# Patient Record
Sex: Female | Born: 1980 | Race: Black or African American | Hispanic: No | Marital: Married | Smoking: Never smoker
Health system: Southern US, Community
[De-identification: ages and names within clinical notes are randomized; demographics above are authoritative.]

## PROBLEM LIST (undated history)

## (undated) ENCOUNTER — Inpatient Hospital Stay (HOSPITAL_COMMUNITY): Payer: Self-pay

## (undated) DIAGNOSIS — F419 Anxiety disorder, unspecified: Secondary | ICD-10-CM

## (undated) DIAGNOSIS — Z9889 Other specified postprocedural states: Secondary | ICD-10-CM

## (undated) DIAGNOSIS — G4730 Sleep apnea, unspecified: Secondary | ICD-10-CM

## (undated) DIAGNOSIS — R519 Headache, unspecified: Secondary | ICD-10-CM

## (undated) DIAGNOSIS — R51 Headache: Secondary | ICD-10-CM

## (undated) DIAGNOSIS — I10 Essential (primary) hypertension: Secondary | ICD-10-CM

## (undated) DIAGNOSIS — R351 Nocturia: Secondary | ICD-10-CM

## (undated) DIAGNOSIS — M2550 Pain in unspecified joint: Secondary | ICD-10-CM

## (undated) DIAGNOSIS — E559 Vitamin D deficiency, unspecified: Secondary | ICD-10-CM

## (undated) DIAGNOSIS — K5900 Constipation, unspecified: Secondary | ICD-10-CM

## (undated) DIAGNOSIS — K219 Gastro-esophageal reflux disease without esophagitis: Secondary | ICD-10-CM

## (undated) DIAGNOSIS — R112 Nausea with vomiting, unspecified: Secondary | ICD-10-CM

## (undated) HISTORY — DX: Anxiety disorder, unspecified: F41.9

## (undated) HISTORY — DX: Vitamin D deficiency, unspecified: E55.9

## (undated) HISTORY — DX: Gastro-esophageal reflux disease without esophagitis: K21.9

## (undated) HISTORY — DX: Constipation, unspecified: K59.00

## (undated) HISTORY — DX: Sleep apnea, unspecified: G47.30

## (undated) HISTORY — DX: Pain in unspecified joint: M25.50

---

## 2002-02-09 ENCOUNTER — Other Ambulatory Visit: Admission: RE | Admit: 2002-02-09 | Discharge: 2002-02-09 | Payer: Self-pay | Admitting: Obstetrics and Gynecology

## 2003-04-27 ENCOUNTER — Other Ambulatory Visit: Admission: RE | Admit: 2003-04-27 | Discharge: 2003-04-27 | Payer: Self-pay | Admitting: Obstetrics and Gynecology

## 2004-06-04 ENCOUNTER — Ambulatory Visit (HOSPITAL_COMMUNITY): Admission: RE | Admit: 2004-06-04 | Discharge: 2004-06-04 | Payer: Self-pay | Admitting: General Surgery

## 2004-06-04 ENCOUNTER — Encounter (INDEPENDENT_AMBULATORY_CARE_PROVIDER_SITE_OTHER): Payer: Self-pay | Admitting: *Deleted

## 2004-06-04 ENCOUNTER — Ambulatory Visit (HOSPITAL_BASED_OUTPATIENT_CLINIC_OR_DEPARTMENT_OTHER): Admission: RE | Admit: 2004-06-04 | Discharge: 2004-06-04 | Payer: Self-pay | Admitting: General Surgery

## 2004-06-04 HISTORY — PX: MASS EXCISION: SHX2000

## 2005-09-03 ENCOUNTER — Inpatient Hospital Stay (HOSPITAL_COMMUNITY): Admission: AD | Admit: 2005-09-03 | Discharge: 2005-09-03 | Payer: Self-pay | Admitting: Obstetrics and Gynecology

## 2005-09-09 ENCOUNTER — Inpatient Hospital Stay (HOSPITAL_COMMUNITY): Admission: RE | Admit: 2005-09-09 | Discharge: 2005-09-12 | Payer: Self-pay | Admitting: Obstetrics and Gynecology

## 2005-09-09 ENCOUNTER — Encounter (INDEPENDENT_AMBULATORY_CARE_PROVIDER_SITE_OTHER): Payer: Self-pay | Admitting: *Deleted

## 2005-09-18 ENCOUNTER — Inpatient Hospital Stay (HOSPITAL_COMMUNITY): Admission: AD | Admit: 2005-09-18 | Discharge: 2005-09-21 | Payer: Self-pay | Admitting: Obstetrics and Gynecology

## 2009-04-09 ENCOUNTER — Inpatient Hospital Stay (HOSPITAL_COMMUNITY): Admission: AD | Admit: 2009-04-09 | Discharge: 2009-04-09 | Payer: Self-pay | Admitting: Obstetrics and Gynecology

## 2009-09-02 ENCOUNTER — Inpatient Hospital Stay (HOSPITAL_COMMUNITY): Admission: AD | Admit: 2009-09-02 | Discharge: 2009-09-02 | Payer: Self-pay | Admitting: Obstetrics and Gynecology

## 2009-09-07 ENCOUNTER — Inpatient Hospital Stay (HOSPITAL_COMMUNITY): Admission: AD | Admit: 2009-09-07 | Discharge: 2009-09-07 | Payer: Self-pay | Admitting: Obstetrics & Gynecology

## 2009-09-09 ENCOUNTER — Ambulatory Visit (HOSPITAL_COMMUNITY): Admission: AD | Admit: 2009-09-09 | Discharge: 2009-09-09 | Payer: Self-pay | Admitting: Obstetrics

## 2009-09-13 ENCOUNTER — Inpatient Hospital Stay (HOSPITAL_COMMUNITY): Admission: RE | Admit: 2009-09-13 | Discharge: 2009-09-16 | Payer: Self-pay | Admitting: Obstetrics and Gynecology

## 2011-02-05 ENCOUNTER — Other Ambulatory Visit: Payer: Self-pay | Admitting: Obstetrics and Gynecology

## 2011-02-06 LAB — COMPREHENSIVE METABOLIC PANEL
ALT: 26 U/L (ref 0–35)
ALT: 26 U/L (ref 0–35)
AST: 35 U/L (ref 0–37)
AST: 35 U/L (ref 0–37)
Albumin: 2.7 g/dL — ABNORMAL LOW (ref 3.5–5.2)
Albumin: 3 g/dL — ABNORMAL LOW (ref 3.5–5.2)
Alkaline Phosphatase: 114 U/L (ref 39–117)
Alkaline Phosphatase: 115 U/L (ref 39–117)
BUN: 2 mg/dL — ABNORMAL LOW (ref 6–23)
BUN: 3 mg/dL — ABNORMAL LOW (ref 6–23)
CO2: 21 mEq/L (ref 19–32)
CO2: 25 mEq/L (ref 19–32)
Calcium: 9.2 mg/dL (ref 8.4–10.5)
Calcium: 9.2 mg/dL (ref 8.4–10.5)
Chloride: 104 mEq/L (ref 96–112)
Chloride: 106 mEq/L (ref 96–112)
Creatinine, Ser: 0.5 mg/dL (ref 0.4–1.2)
Creatinine, Ser: 0.71 mg/dL (ref 0.4–1.2)
GFR calc Af Amer: >60 mL/min (ref >60)
GFR calc Af Amer: >60 mL/min (ref >60)
GFR calc non Af Amer: >60 mL/min (ref >60)
GFR calc non Af Amer: >60 mL/min (ref >60)
Glucose, Bld: 70 mg/dL (ref 70–99)
Glucose, Bld: 77 mg/dL (ref 70–99)
Potassium: 3 mEq/L — ABNORMAL LOW (ref 3.5–5.1)
Potassium: 3.9 mEq/L (ref 3.5–5.1)
Sodium: 134 mEq/L — ABNORMAL LOW (ref 135–145)
Sodium: 136 mEq/L (ref 135–145)
Total Bilirubin: 0.5 mg/dL (ref 0.3–1.2)
Total Bilirubin: 0.7 mg/dL (ref 0.3–1.2)
Total Protein: 6 g/dL (ref 6.0–8.3)
Total Protein: 7.2 g/dL (ref 6.0–8.3)

## 2011-02-06 LAB — BASIC METABOLIC PANEL
BUN: 4 mg/dL — ABNORMAL LOW (ref 6–23)
BUN: 7 mg/dL (ref 6–23)
CO2: 23 mEq/L (ref 19–32)
CO2: 23 mEq/L (ref 19–32)
Calcium: 8.9 mg/dL (ref 8.4–10.5)
Calcium: 9.3 mg/dL (ref 8.4–10.5)
Chloride: 104 mEq/L (ref 96–112)
Chloride: 106 mEq/L (ref 96–112)
Creatinine, Ser: 0.58 mg/dL (ref 0.4–1.2)
Creatinine, Ser: 0.61 mg/dL (ref 0.4–1.2)
GFR calc Af Amer: >60 mL/min (ref >60)
GFR calc Af Amer: >60 mL/min (ref >60)
GFR calc non Af Amer: >60 mL/min (ref >60)
GFR calc non Af Amer: >60 mL/min (ref >60)
Glucose, Bld: 118 mg/dL — ABNORMAL HIGH (ref 70–99)
Glucose, Bld: 77 mg/dL (ref 70–99)
Potassium: 3.2 mEq/L — ABNORMAL LOW (ref 3.5–5.1)
Potassium: 3.8 mEq/L (ref 3.5–5.1)
Sodium: 133 mEq/L — ABNORMAL LOW (ref 135–145)
Sodium: 136 mEq/L (ref 135–145)

## 2011-02-06 LAB — CREATININE CLEARANCE, URINE, 24 HOUR
Collection Interval-CRCL: 24 hours
Creatinine Clearance: 170 mL/min — ABNORMAL HIGH (ref 75–115)
Creatinine, 24H Ur: 1742 mg/d (ref 700–1800)
Creatinine, Urine: 79.2 mg/dL
Creatinine: 0.71 mg/dL (ref 0.40–1.20)
Urine Total Volume-CRCL: 2200 mL

## 2011-02-06 LAB — CBC
HCT: 28.5 % — ABNORMAL LOW (ref 36.0–46.0)
HCT: 36.1 % (ref 36.0–46.0)
HCT: 36.6 % (ref 36.0–46.0)
HCT: 36.9 % (ref 36.0–46.0)
Hemoglobin: 11.9 g/dL — ABNORMAL LOW (ref 12.0–15.0)
Hemoglobin: 12.3 g/dL (ref 12.0–15.0)
Hemoglobin: 12.3 g/dL (ref 12.0–15.0)
Hemoglobin: 9.6 g/dL — ABNORMAL LOW (ref 12.0–15.0)
MCHC: 33.1 g/dL (ref 30.0–36.0)
MCHC: 33.4 g/dL (ref 30.0–36.0)
MCHC: 33.7 g/dL (ref 30.0–36.0)
MCHC: 33.7 g/dL (ref 30.0–36.0)
MCV: 95.8 fL (ref 78.0–100.0)
MCV: 96.3 fL (ref 78.0–100.0)
MCV: 96.4 fL (ref 78.0–100.0)
MCV: 97.3 fL (ref 78.0–100.0)
Platelets: 192 10*3/uL (ref 150–400)
Platelets: 228 10*3/uL (ref 150–400)
Platelets: 253 10*3/uL (ref 150–400)
Platelets: 259 10*3/uL (ref 150–400)
RBC: 2.93 MIL/uL — ABNORMAL LOW (ref 3.87–5.11)
RBC: 3.75 MIL/uL — ABNORMAL LOW (ref 3.87–5.11)
RBC: 3.82 MIL/uL — ABNORMAL LOW (ref 3.87–5.11)
RBC: 3.83 MIL/uL — ABNORMAL LOW (ref 3.87–5.11)
RDW: 14.3 % (ref 11.5–15.5)
RDW: 14.4 % (ref 11.5–15.5)
RDW: 14.6 % (ref 11.5–15.5)
RDW: 14.7 % (ref 11.5–15.5)
WBC: 10.6 10*3/uL — ABNORMAL HIGH (ref 4.0–10.5)
WBC: 11.3 10*3/uL — ABNORMAL HIGH (ref 4.0–10.5)
WBC: 13.1 10*3/uL — ABNORMAL HIGH (ref 4.0–10.5)
WBC: 17.3 10*3/uL — ABNORMAL HIGH (ref 4.0–10.5)

## 2011-02-06 LAB — LACTATE DEHYDROGENASE
LDH: 155 U/L (ref 94–250)
LDH: 169 U/L (ref 94–250)

## 2011-02-06 LAB — URIC ACID
Uric Acid, Serum: 5.3 mg/dL (ref 2.4–7.0)
Uric Acid, Serum: 5.4 mg/dL (ref 2.4–7.0)

## 2011-02-06 LAB — RPR: RPR Ser Ql: NONREACTIVE

## 2011-02-07 LAB — COMPREHENSIVE METABOLIC PANEL
ALT: 25 U/L (ref 0–35)
AST: 32 U/L (ref 0–37)
Albumin: 2.8 g/dL — ABNORMAL LOW (ref 3.5–5.2)
Alkaline Phosphatase: 112 U/L (ref 39–117)
BUN: 3 mg/dL — ABNORMAL LOW (ref 6–23)
CO2: 25 mEq/L (ref 19–32)
Calcium: 9 mg/dL (ref 8.4–10.5)
Chloride: 107 mEq/L (ref 96–112)
Creatinine, Ser: 0.65 mg/dL (ref 0.4–1.2)
GFR calc Af Amer: >60 mL/min (ref >60)
GFR calc non Af Amer: >60 mL/min (ref >60)
Glucose, Bld: 91 mg/dL (ref 70–99)
Potassium: 3.3 mEq/L — ABNORMAL LOW (ref 3.5–5.1)
Sodium: 137 mEq/L (ref 135–145)
Total Bilirubin: 0.8 mg/dL (ref 0.3–1.2)
Total Protein: 6.6 g/dL (ref 6.0–8.3)

## 2011-02-07 LAB — URINALYSIS, ROUTINE W REFLEX MICROSCOPIC
Bilirubin Urine: NEGATIVE
Glucose, UA: NEGATIVE mg/dL
Hgb urine dipstick: NEGATIVE
Ketones, ur: NEGATIVE mg/dL
Nitrite: NEGATIVE
Protein, ur: NEGATIVE mg/dL
Specific Gravity, Urine: <1.005 — ABNORMAL LOW (ref 1.005–1.030)
Urobilinogen, UA: 0.2 mg/dL (ref 0.0–1.0)
pH: 7 (ref 5.0–8.0)

## 2011-02-07 LAB — URIC ACID: Uric Acid, Serum: 5.2 mg/dL (ref 2.4–7.0)

## 2011-02-07 LAB — CBC
HCT: 35.2 % — ABNORMAL LOW (ref 36.0–46.0)
Hemoglobin: 11.8 g/dL — ABNORMAL LOW (ref 12.0–15.0)
MCHC: 33.3 g/dL (ref 30.0–36.0)
MCV: 97.2 fL (ref 78.0–100.0)
Platelets: 256 10*3/uL (ref 150–400)
RBC: 3.63 MIL/uL — ABNORMAL LOW (ref 3.87–5.11)
RDW: 14.1 % (ref 11.5–15.5)
WBC: 10.2 10*3/uL (ref 4.0–10.5)

## 2011-02-07 LAB — LACTATE DEHYDROGENASE: LDH: 155 U/L (ref 94–250)

## 2011-03-22 NOTE — Op Note (Signed)
NAME:  Stephanie Hays, Stephanie Hays                           ACCOUNT NO.:  [id number]   MEDICAL RECORD NO.:  [id number]                   PATIENT TYPE:  AMB   LOCATION:  DSC                                  FACILITY:  [facility]   PHYSICIAN:  Vikki Ports, M.D.         DATE OF BIRTH:  Sep 10, 1981   DATE OF PROCEDURE:  06/04/2004  DATE OF DISCHARGE:                                 OPERATIVE REPORT   REFERRING PHYSICIAN:  Maxie Better, M.D.   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   PROCEDURE:  Excision of mass of subcutaneous tissue of the abdominal wall.   ANESTHESIA:  General anesthesia.   SURGEON:  Vikki Ports, M.D.   DESCRIPTION OF PROCEDURE:  The patient was taken to the operating room and  placed in the supine position.  After adequate general anesthesia was  induced, the abdomen was prepped and draped in normal sterile fashion.  Using a transverse incision through the previous Pfannenstiel incision over  the right lateral aspect of the scar, I dissected down onto a palpable mass  which was very adherent to the fascia.  This measured about 3 x 3 cm  and  was excised in its entirety.  The fascia did have a slight defect and  therefore was closed with interrupted 3.0 Surgilon.  Subcutaneous tissue was  closed with 2-0 Vicryl.  Skin was closed with subcuticular 3-0 Monocryl.  Dermabond dressing was placed.  The patient tolerated the procedure well and  went to PACU in good condition.                                               Vikki Ports, M.D.    [facility]/MEDQ  D:  06/05/2004  T:  06/05/2004  Job:  [id number]   cc:   Maxie Better, M.D.  [address]  [place]  [place] [address]  Fax: [phone]

## 2011-03-22 NOTE — Discharge Summary (Signed)
NAMEAUNDREA, Stephanie Hays                 ACCOUNT NO.:  [id number]   MEDICAL RECORD NO.:  [id number]          PATIENT TYPE:  INP   LOCATION:  9374                          [facility]   PHYSICIAN:  Maxie Better, M.D.DATE OF BIRTH:  1981-09-22   DATE OF ADMISSION:  09/18/2005  DATE OF DISCHARGE:  09/21/2005                                 DISCHARGE SUMMARY   ADMISSION DIAGNOSIS:  Postpartum preeclampsia.   DISCHARGE DIAGNOSIS:  Postpartum preeclampsia, resolved.   HISTORY OF PRESENT ILLNESS:  A 30 year old G71, P1-1-0-1 female at nine days  postpartum status post a repeat cesarean section, in labor, who presented to  the office complaining of increased leg swelling and chest pain for four  days duration.  The patient had no headaches, visual changes or epigastric  pain.  She had no history of hypertension.  The pain was described as dull  and noted at rest there was no calf pain.  The patient was evaluated in the  office and then sent to the hospital for further lab results and monitoring.   HOSPITAL COURSE:  The patient presented to maternity admissions.  Her blood  pressure was 176/97, temperature 98, pulse 56.  Her exam was notable for  heart regular rate and rhythm without murmur, lungs clear to auscultation.  Her incision had a small superficial area that was slightly open, but  otherwise, unremarkable.  Extremities had 1 to 2+ edema.  Deep tendon  reflexes was 1+, no clonus.  PIH labs were obtained that showed a uric acid  of 6.7, hematocrit 34.8, platelets 395,000.  SGOT was 44, SGPT 77.  The  patient had chest x-ray that showed mild cardiomegaly.  No pulmonary edema.  EKG showed sinus bradycardia.  Given the exam and the labs, the patient was  diagnosed with postpartum preeclampsia.  She was admitted.  Magnesium  sulfate was started.  She was given a GI cocktail for her chest discomfort  and Labetalol 100 mg p.o. b.i.d. was started.  The exam was not suggestive  of a PE,  and pulmonary edema had been ruled out by the chest x-ray.  The  patient was continued on magnesium sulfate.  The chest pain resolved by  hospital day #2.  She had serial PIH labs drawn.  LFT's began improving.  The patient subsequently started to have good diuresing for the magnesium  after about 36 hours.  By hospital day #4, the patient's blood pressure was  130-140/75-85.  She felt well.  No shortness of breath.  No other symptoms.  Her edema had resolved.  Deep tendon reflexes were normal.  PIH labs were  redone, and thus showed AST 25, ALT 38 which were normal.  Uric acid was  7.4.  Her hematocrit was 36.8.  Magnesium sulfate was discontinued.  The  patient was sent discharged home after 4 hours after discontinuation of the  magnesium.   DISPOSITION:  Home.   CONDITION ON DISCHARGE:  Stable.   FOLLOWUP:  Follow up at [facility] in one week.   DISCHARGE MEDICATIONS:  1.  Labetalol 100 mg p.o.  b.i.d.  2.  Tylox 1-2 tablets q.3-4 h p.r.n. pain.  3.  Prenatal vitamins one p.o. daily.   DISCHARGE INSTRUCTIONS:  Discharge instructions were for preeclampsia  warning signs and continue the discharge instructions for her previous  admission for her cesarean section.      Maxie Better, M.D.  Electronically Signed     [place]/MEDQ  D:  10/30/2005  T:  10/30/2005  Job:  [id number]

## 2011-03-22 NOTE — Discharge Summary (Signed)
Stephanie Hays, Stephanie Hays                 ACCOUNT NO.:  [id number]   MEDICAL RECORD NO.:  [id number]          PATIENT TYPE:  INP   LOCATION:  9126                          [facility]   PHYSICIAN:  Maxie Better, M.D.DATE OF BIRTH:  1981/02/02   DATE OF ADMISSION:  09/09/2005  DATE OF DISCHARGE:  09/12/2005                                 DISCHARGE SUMMARY   ADDITIONAL DIAGNOSES:  1.  Previous cesarean section.  2.  Labor.  3.  Intrauterine gestation at 37-3/7 weeks.   DISCHARGE DIAGNOSES:  1.  Intrauterine gestation at 37-3/7 weeks, delivered.  2.  Labor.  3.  Previous cesarean section.   PROCEDURES:  Repeat cesarean section for hysterotomy.   HISTORY OF PRESENT ILLNESS:  This is a 30 year old gravida 2, para 0-1-0-0,  married black female at 37-3/7 weeks who was scheduled for a repeat cesarean  section who presented in labor on September 09, 2005.  At the time that the  patient presented, she was 3, 80%, -2 and a bag of water intact.  She had a  reactive tracing.  The patient was counseled regarding a repeat cesarean  section.  The patient had had a 30 year old gravida 2, para 0-1-0-0 with subsequent neonatal death 31  hours of life.   HOSPITAL COURSE:  The patient was admitted to [facility].  She was  taken to the operating room where she underwent a repeat cesarean section.  The procedure resulted in a delivery of a live female from the right occiput  transverse position.  Apgars of 9 and 9.  Cord around the neck x1 was noted  at the time.  Weight of the baby was 6 pounds 4 ounces.  Postoperatively,  the patient did well.  She had a CBC done on postop day #1 that showed a  hemoglobin of 11.0, white count 11.6, platelet 223,000.  By postop day #3,  the patient was afebrile, tolerating a regular diet.  No evidence of an  infection.  She was to be discharged home.   DISPOSITION:  Home.   CONDITION ON DISCHARGE:  Stable.   DISCHARGE MEDICATIONS:  1.  Tylox #20 one p.o. q.4 h p.r.n. pain.  2.  Prenatal vitamins one p.o. daily.   FOLLOWUP:  Follow up appointment at  [facility] in four weeks.   DISCHARGE INSTRUCTIONS:  Postpartum booklet given.      Maxie Better, M.D.  Electronically Signed     [place]/MEDQ  D:  10/30/2005  T:  10/30/2005  Job:  [id number]

## 2011-03-22 NOTE — Op Note (Signed)
Stephanie Hays, Stephanie Hays                 ACCOUNT NO.:  [id number]   MEDICAL RECORD NO.:  [id number]          PATIENT TYPE:  INP   LOCATION:  9126                          [facility]   PHYSICIAN:  Maxie Better, M.D.DATE OF BIRTH:  17-Feb-1981   DATE OF PROCEDURE:  09/09/2005  DATE OF DISCHARGE:                                 OPERATIVE REPORT   PREOPERATIVE DIAGNOSES:  1.  Previous cesarean section.  2.  Early labor.   OPERATION/PROCEDURE:  Repeat cesarean section, Kerr hysterotomy.   POSTOPERATIVE DIAGNOSES:  1.  Previous cesarean section.  2.  Early labor.   ANESTHESIA:  Spinal.   SURGEON:  Maxie Better, M.D.   ASSISTANT:  None.   INDICATIONS:  A 30 year old, gravida 2, para 0-1-0-0 female at 38+ weeks  with a previous low vertical uterine incision who presented in the office  and was found to be 3, 80%, -2, vertex presentation in early labor and who  now presents for repeat cesarean section.  The patient was initially  scheduled for a repeat cesarean section on September 19, 2005.  She has been  contracting on a regular basis with fetal movement and intact membranes.  The risks and benefits of the procedure have been explained to the patient.  Consent was signed. The patient was transferred to the operating room.   DESCRIPTION OF PROCEDURE:  Under adequate spinal anesthesia, the patient was  placed in the supine position with the left lateral tilt. She was sterilely  prepped and draped in the usual fashion.  An indwelling Foley catheter was  sterilely placed and 10 mL of 0.25% Marcaine was injected along the  previously Pfannenstiel skin incision.  Pfannenstiel skin incision was then  made through the previous scar, carried down to the rectus fascia.  Rectus  fascia was incised in the midline, extended bilaterally.  The rectus fascia  was then bluntly and sharply dissected off the rectus muscle in superior and  inferior fashion.  The rectus muscle was split in  the midline sharply.  The  parietal peritoneum was subsequently opened in the process.  On entering the  abdominal cavity, omentum adhesions were noted on the left anterior  abdominal wall and were lysed.  The vesicouterine peritoneum then opened  transversely.  The bladder was then displaced inferiorly after blunt  dissection.  A curvilinear low transverse uterine incision was then made and  extended bilaterally using the bandage scissors.  Artificial rupture of  membranes was then performed.  Copious clear amount of fluid was noted.  Subsequent delivery of a live female from the right occipital transverse  position was accomplished.  The baby was bulb suctioned on the abdomen.  Cord around the neck x1 was reducible.  Baby was delivered.  Cord was  clamped and cut.  The patient was transverse to the awaiting pediatricians  who assigned Apgars of 9 and 9 at one and five minutes.  The weight of the  baby was 6 pounds 4 ounces.   The placenta was spontaneous intact.  The uterine cavity was then cleaned of  debris.  Uterine  incision noted no extension.  Uterine incision was then  closed in two layers, the first layer with 0 Monocryl running locked stitch.  Second layer was imbricated using 0 Monocryl suture.  Small bleeders along  the peritoneal edges were cauterized.  The abdomen was copiously irrigated  and suctioned.  Paracolic gutters were cleaned of debris.  Normal tubes were  noted bilaterally.  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  Uterine incision was  reinspected.  Good hemostasis was then noted.  The parietal peritoneum was  then closed with 2-0 Vicryl running stitch.  The rectus fascia was closed  with 0 Vicryl x2.  The subcutaneous area was irrigated and suctioned with 3-  0 plain suture, interrupted was in place.  The skin was approximated using  Ethicon staples.  Specimen was placenta sent to pathology.  Estimated  blood  loss was 600 mL.  Intraoperative fluid was 2200 mL crystalloid.  Urine  output was 100 mL clear yellow urine.  Sponge and instruments counts x2 were  correct.  There were no complications.   The patient tolerated the procedure well and was transferred to the recovery  room in stable condition.      Maxie Better, M.D.  Electronically Signed     [place]/MEDQ  D:  09/09/2005  T:  09/10/2005  Job:  [id number]

## 2011-10-09 LAB — OB RESULTS CONSOLE HEPATITIS B SURFACE ANTIGEN: Hepatitis B Surface Ag: NEGATIVE

## 2011-10-09 LAB — OB RESULTS CONSOLE HIV ANTIBODY (ROUTINE TESTING): HIV: NONREACTIVE

## 2011-10-09 LAB — OB RESULTS CONSOLE ABO/RH: RH Type: POSITIVE

## 2011-10-09 LAB — OB RESULTS CONSOLE GC/CHLAMYDIA: Chlamydia: NEGATIVE

## 2012-02-20 LAB — OB RESULTS CONSOLE RPR: RPR: NONREACTIVE

## 2012-03-18 ENCOUNTER — Encounter (HOSPITAL_COMMUNITY): Payer: Self-pay | Admitting: *Deleted

## 2012-03-18 ENCOUNTER — Inpatient Hospital Stay (HOSPITAL_COMMUNITY)
Admission: AD | Admit: 2012-03-18 | Discharge: 2012-03-18 | Disposition: A | Discharge status: Home / Self Care | Payer: BC Managed Care – PPO | Source: Ambulatory Visit | Attending: Obstetrics and Gynecology | Admitting: Obstetrics and Gynecology

## 2012-03-18 ENCOUNTER — Inpatient Hospital Stay (HOSPITAL_COMMUNITY): Payer: BC Managed Care – PPO

## 2012-03-18 DIAGNOSIS — O368390 Maternal care for abnormalities of the fetal heart rate or rhythm, unspecified trimester, not applicable or unspecified: Secondary | ICD-10-CM | POA: Insufficient documentation

## 2012-03-18 HISTORY — DX: Essential (primary) hypertension: I10

## 2012-03-18 LAB — URIC ACID: Uric Acid, Serum: 4.8 mg/dL (ref 2.4–7.0)

## 2012-03-18 LAB — COMPREHENSIVE METABOLIC PANEL
AST: 31 U/L (ref 0–37)
Albumin: 2.6 g/dL — ABNORMAL LOW (ref 3.5–5.2)
BUN: 5 mg/dL — ABNORMAL LOW (ref 6–23)
Calcium: 9.1 mg/dL (ref 8.4–10.5)
Chloride: 102 mEq/L (ref 96–112)
Creatinine, Ser: 0.69 mg/dL (ref 0.50–1.10)
Total Bilirubin: 0.3 mg/dL (ref 0.3–1.2)

## 2012-03-18 LAB — CBC
HCT: 32.4 % — ABNORMAL LOW (ref 36.0–46.0)
MCH: 31 pg (ref 26.0–34.0)
MCHC: 34 g/dL (ref 30.0–36.0)
MCV: 91.3 fL (ref 78.0–100.0)
Platelets: 236 10*3/uL (ref 150–400)
RDW: 13.6 % (ref 11.5–15.5)
WBC: 9.3 10*3/uL (ref 4.0–10.5)

## 2012-03-18 MED ORDER — POTASSIUM CHLORIDE CRYS ER 20 MEQ PO TBCR
40.0000 meq | EXTENDED_RELEASE_TABLET | Freq: Once | ORAL | Status: AC
  Administered 2012-03-18: 40 meq via ORAL
  Filled 2012-03-18: qty 2

## 2012-03-18 NOTE — MAU Note (Signed)
Pt sent from MD office for monitoring, pt states FHR had "dips" on the monitor & wasn't moving much.

## 2012-03-18 NOTE — Progress Notes (Signed)
Lab results reported to MD, K-dur order received, also DC order.

## 2012-03-18 NOTE — Discharge Instructions (Signed)
Hypokalemia Hypokalemia means a low potassium level in the blood.Potassium is an electrolyte that helps regulate the amount of fluid in the body. It also stimulates muscle contraction and maintains a stable acid-base balance.Most of the body's potassium is inside of cells, and only a very small amount is in the blood. Because the amount in the blood is so small, minor changes can have big effects. PREPARATION FOR TEST Testing for potassium requires taking a blood sample taken by needle from a vein in the arm. The skin is cleaned thoroughly before the sample is drawn. There is no other special preparation needed. NORMAL VALUES Potassium levels below 3.5 mEq/L are abnormally low. Levels above 5.1 mEq/L are abnormally high. Ranges for normal findings may vary among different laboratories and hospitals. You should always check with your doctor after having lab work or other tests done to discuss the meaning of your test results and whether your values are considered within normal limits. MEANING OF TEST  Your caregiver will go over the test results with you and discuss the importance and meaning of your results, as well as treatment options and the need for additional tests, if necessary. A potassium level is frequently part of a routine medical exam. It is usually included as part of a whole "panel" of tests for several blood salts (such as Sodium and Chloride). It may be done as part of follow-up when a low potassium level was found in the past or other blood salts are suspected of being out of balance. A low potassium level might be suspected if you have one or more of the following:  Symptoms of weakness.   Abnormal heart rhythms.   High blood pressure and are taking medication to control this, especially water pills (diuretics).   Kidney disease that can affect your potassium level .   Diabetes requiring the use of insulin. The potassium may fall after taking insulin, especially if the  diabetes had been out of control for a while.   A condition requiring the use of cortisone-type medication or certain types of antibiotics.   Vomiting and/or diarrhea for more than a day or two.   A stomach or intestinal condition that may not permit appropriate absorption of potassium.   Fainting episodes.   Mental confusion.  OBTAINING TEST RESULTS It is your responsibility to obtain your test results. Ask the lab or department performing the test when and how you will get your results.  Please contact your caregiver directly if you have not received the results within one week. At that time, ask if there is anything different or new you should be doing in relation to the results. TREATMENT Hypokalemia can be treated with potassium supplements taken by mouth and/or adjustments in your current medications. A diet high in potassium is also helpful. Foods with high potassium content are:  Peas, lentils, lima beans, nuts, and dried fruit.   Whole grain and bran cereals and breads.   Fresh fruit, vegetables (bananas, cantaloupe, grapefruit, oranges, tomatoes, honeydew melons, potatoes).   Orange and tomato juices.   Meats. If potassium supplement has been prescribed for you today or your medications have been adjusted, see your personal caregiver in time02 for a re-check.  SEEK MEDICAL CARE IF:  There is a feeling of worsening weakness.   You experience repeated chest palpitations.   You are diabetic and having difficulty keeping your blood sugars in the normal range.   You are experiencing vomiting and/or diarrhea.   You are having  difficulty with any of your regular medications.  SEEK IMMEDIATE MEDICAL CARE IF:  You experience chest pain, shortness of breath, or episodes of dizziness.   You have been having vomiting or diarrhea for more than 2 days.   You have a fainting episode.  MAKE SURE YOU:   Understand these instructions.   Will watch your condition.   Will  get help right away if you are not doing well or get worse.  Document Released: 10/21/2005 Document Revised: 10/10/2011 Document Reviewed: 10/01/2008 [facility] Patient Information 2012 [place], [place].Hypertension During Pregnancy Hypertension is also called high blood pressure. It can occur at any time in life and during pregnancy. When you have hypertension, there is extra pressure inside your blood vessels that carry blood from the heart to the rest of your body (arteries). Hypertension during pregnancy can cause problems for you and your baby. Your baby might not weigh as much as it should at birth or might be born early (premature). Very bad cases of hypertension during pregnancy can be life-threatening.  There are different types of hypertension during pregnancy.   Chronic hypertension. This happens when a woman has hypertension before pregnancy and it continues during pregnancy.   Gestational hypertension. This is when hypertension develops during pregnancy.   Preeclampsia or toxemia of pregnancy. This is a very serious type of hypertension that develops only during pregnancy. It is a disease that affects the whole body (systemic) and can be very dangerous for both mother and baby.   Gestational hypertension and preeclampsia usually go away after your baby is born. Blood pressure generally stabilizes within 6 weeks. Women who have hypertension during pregnancy have a greater chance of developing hypertension later in life or with future pregnancies. UNDERSTANDING BLOOD PRESSURE Blood pressure moves blood in your body. Sometimes, the force that moves the blood becomes too strong.  A blood pressure reading is given in 2 numbers and looks like a fraction.   The top number is called the systolic pressure. When your heart beats, it forces more blood to flow through the arteries. Pressure inside the arteries goes up.   The bottom number is the diastolic pressure. Pressure goes down between beats.  That is when the heart is resting.   You may have hypertension if:   Your systolic blood pressure is above 140.   Your diastolic pressure is above 90.  RISK FACTORS Some factors make you more likely to develop hypertension during pregnancy. Risk factors include:  Having hypertension before pregnancy.   Having hypertension during a previous pregnancy.   Being overweight.   Being older than 40.   Being pregnant with more than 1 baby (multiples).   Having diabetes or kidney problems.  SYMPTOMS Chronic and gestational hypertension may not cause symptoms. Preeclampsia has symptoms, which may include:  Increased protein in your urine. Your caregiver will check for this at every prenatal visit.   Swelling of your hands and face.   Rapid weight gain.   Headaches.   Visual changes.   Being bothered by light.   Abdominal pain, especially in the right upper area.   Chest pain.   Shortness of breath.   Increased reflexes.   Seizures. Seizures occur with a more severe form of preeclampsia, called eclampsia.  DIAGNOSIS   You may be diagnosed with hypertension during pregnancy during a regular prenatal exam. At each visit, tests may include:   Blood pressure checks.   A urine test to check for protein in your urine.  The type of hypertension you are diagnosed with depends on when you developed it. It also depends on your specific blood pressure reading.   Developing hypertension before 20 weeks of pregnancy is consistent with chronic hypertension.   Developing hypertension after 20 weeks of pregnancy is consistent with gestational hypertension.   Hypertension with increased urinary protein is diagnosed as preeclampsia.   Blood pressure measurements that stay above 160 systolic or 110 diastolic are a sign of severe preeclampsia.  TREATMENT Treatment for hypertension during pregnancy varies. Treatment depends on the type of hypertension and how serious it is.  If you  take medicine for chronic hypertension, you may need to switch medicines.   Drugs called ACE inhibitors should not be taken during pregnancy.   Low-dose aspirin may be suggested for women who have risk factors for preeclampsia.   If you have gestational hypertension, you may need to take a blood pressure medicine that is safe during pregnancy. Your caregiver will recommend the appropriate medicine.   If you have severe preeclampsia, you may need to be in the hospital. Caregivers will watch you and the baby very closely. You also may need to take medicine (magnesium sulfate) to prevent seizures and lower blood pressure.   Sometimes an early delivery is needed. This may be the case if the condition worsens. It would be done to protect you and the baby. The only cure for preeclampsia is delivery.  HOME CARE INSTRUCTIONS  Schedule and keep all of your regular prenatal care.   Follow your caregiver's instructions for taking medicines. Tell your caregiver about all medicines you take. This includes over-the-counter medicines.   Eat as little salt as possible.   Get regular exercise.   Do not drink alcohol.   Do not use tobacco products.   Do not drink products with caffeine.   Lie on your left side when resting.   Tell your doctor if you have any preeclampsia symptoms.  SEEK IMMEDIATE MEDICAL CARE IF:  You have severe abdominal pain.   You have sudden swelling in the hands, ankles, or face.   You gain 4 pounds (1.8 kg) or more in 1 week.   You vomit repeatedly.   You have vaginal bleeding.   You do not feel the baby moving as much.   You have a headache.   You have blurred or double vision.   You have muscle twitching or spasms.   You have shortness of breath.   You have blue fingernails and lips.   You have blood in your urine.  MAKE SURE YOU:  Understand these instructions.   Will watch your condition.   Will get help right away if you are not doing well.    Document Released: 07/09/2011 Document Revised: 10/10/2011 Document Reviewed: 07/09/2011 [facility] Patient Information 2012 [place], [place].

## 2012-03-18 NOTE — Progress Notes (Signed)
MD informed pt is here FHR non-reactive, BP 165/92... MD is putting in orders for BPP & dopplers.

## 2012-03-18 NOTE — MAU Note (Signed)
History   cc; subtle decels noted on office NST w/o assoc contractions  No chief complaint on file.  Cc; NR NST w/ decels on monitor  OB History    Grav Para Term Preterm Abortions TAB SAB Ect Mult Living   4 3 2 1      2       Past Medical History  Diagnosis Date  . Hypertension   . Endometriosis     Past Surgical History  Procedure Date  . Cesarean section   . Laparoscopy     History reviewed. No pertinent family history.  History  Substance Use Topics  . Smoking status: Never Smoker   . Smokeless tobacco: Not on file  . Alcohol Use: No    Allergies: No Known Allergies  Prescriptions prior to admission  Medication Sig Dispense Refill  . metoprolol succinate (TOPROL-XL) 100 MG 24 hr tablet Take 100 mg by mouth daily. Take with or immediately following a meal.      . NIFEdipine (PROCARDIA XL/ADALAT-CC) 60 MG 24 hr tablet Take 60 mg by mouth daily.      . Prenatal MV-Min-Fe Fum-FA-DHA [facility] PRENATAL EARLY SHIELD) 27-0.8 & 200 MG MISC Take 1 tablet by mouth every morning.         Physical Exam   Blood pressure 165/92, pulse 84, temperature 98.9 F (37.2 C), temperature source Oral, resp. rate 18, height 5\' 3"  (1.6 m), weight 119.568 kg (263 lb 9.6 oz).  No exam performed today, done in office. PIH labs normal except low K Tracing reviewed; good variability baseline 140's(-)ctx BPP 8/8, nl dopplers  ED Course  Reassuring fetal testing. No further evidence of decel.   Chronic HTN on med Hypokalemia P) replete K w/ oral kdur. Daily kick ct. 24 hr UTP/CrCL collection and f/u office Monday or Tuesday. Cont BP meds  MDM   Natan Hartog A, MD 6:09 PM 03/18/2012

## 2012-04-10 LAB — OB RESULTS CONSOLE GBS: GBS: NEGATIVE

## 2012-04-26 ENCOUNTER — Encounter (HOSPITAL_COMMUNITY): Payer: Self-pay | Admitting: Pharmacist

## 2012-04-28 ENCOUNTER — Other Ambulatory Visit: Payer: Self-pay | Admitting: Obstetrics and Gynecology

## 2012-04-30 ENCOUNTER — Inpatient Hospital Stay (HOSPITAL_COMMUNITY)
Admission: AD | Admit: 2012-04-30 | Discharge: 2012-05-06 | DRG: 651 | Disposition: A | Discharge status: Home / Self Care | Payer: BC Managed Care – PPO | Source: Ambulatory Visit | Attending: Obstetrics and Gynecology | Admitting: Obstetrics and Gynecology

## 2012-04-30 ENCOUNTER — Encounter (HOSPITAL_COMMUNITY): Payer: Self-pay | Admitting: *Deleted

## 2012-04-30 ENCOUNTER — Other Ambulatory Visit: Payer: Self-pay | Admitting: Obstetrics and Gynecology

## 2012-04-30 DIAGNOSIS — O119 Pre-existing hypertension with pre-eclampsia, unspecified trimester: Secondary | ICD-10-CM | POA: Diagnosis present

## 2012-04-30 DIAGNOSIS — O34219 Maternal care for unspecified type scar from previous cesarean delivery: Secondary | ICD-10-CM | POA: Diagnosis present

## 2012-04-30 DIAGNOSIS — IMO0002 Reserved for concepts with insufficient information to code with codable children: Principal | ICD-10-CM | POA: Diagnosis present

## 2012-04-30 DIAGNOSIS — I10 Essential (primary) hypertension: Secondary | ICD-10-CM | POA: Diagnosis present

## 2012-04-30 LAB — URINALYSIS, ROUTINE W REFLEX MICROSCOPIC
Bilirubin Urine: NEGATIVE
Hgb urine dipstick: NEGATIVE
Ketones, ur: NEGATIVE mg/dL
Leukocytes, UA: NEGATIVE
Nitrite: NEGATIVE
Specific Gravity, Urine: 1.02 (ref 1.005–1.030)
Specific Gravity, Urine: 1.015 (ref 1.005–1.030)
Urobilinogen, UA: 1 mg/dL (ref 0.0–1.0)
pH: 6 (ref 5.0–8.0)
pH: 6.5 (ref 5.0–8.0)

## 2012-04-30 LAB — URINE MICROSCOPIC-ADD ON

## 2012-04-30 LAB — CBC
HCT: 35.5 % — ABNORMAL LOW (ref 36.0–46.0)
MCV: 91.5 fL (ref 78.0–100.0)
Platelets: 244 10*3/uL (ref 150–400)
RBC: 3.88 MIL/uL (ref 3.87–5.11)
RDW: 14 % (ref 11.5–15.5)
WBC: 9 10*3/uL (ref 4.0–10.5)

## 2012-04-30 LAB — COMPREHENSIVE METABOLIC PANEL
AST: 26 U/L (ref 0–37)
Albumin: 2.8 g/dL — ABNORMAL LOW (ref 3.5–5.2)
Alkaline Phosphatase: 105 U/L (ref 39–117)
BUN: 8 mg/dL (ref 6–23)
CO2: 23 mEq/L (ref 19–32)
Chloride: 102 mEq/L (ref 96–112)
GFR calc non Af Amer: >90 mL/min (ref >90)
Potassium: 3.4 mEq/L — ABNORMAL LOW (ref 3.5–5.1)
Total Bilirubin: 0.5 mg/dL (ref 0.3–1.2)

## 2012-04-30 MED ORDER — NIFEDIPINE ER 60 MG PO TB24
60.0000 mg | ORAL_TABLET | Freq: Every day | ORAL | Status: DC
  Administered 2012-05-01: 60 mg via ORAL
  Filled 2012-04-30 (×2): qty 1

## 2012-04-30 MED ORDER — MAGNESIUM SULFATE BOLUS VIA INFUSION
4.0000 g | Freq: Once | INTRAVENOUS | Status: AC
  Administered 2012-04-30: 4 g via INTRAVENOUS
  Filled 2012-04-30: qty 500

## 2012-04-30 MED ORDER — MAGNESIUM SULFATE 40 G IN LACTATED RINGERS - SIMPLE
2.0000 g/h | INTRAVENOUS | Status: DC
  Administered 2012-05-01: 2 g/h via INTRAVENOUS
  Filled 2012-04-30 (×2): qty 500

## 2012-04-30 MED ORDER — DOCUSATE SODIUM 100 MG PO CAPS
100.0000 mg | ORAL_CAPSULE | Freq: Every day | ORAL | Status: DC
  Administered 2012-05-01: 100 mg via ORAL
  Filled 2012-04-30: qty 1

## 2012-04-30 MED ORDER — CALCIUM CARBONATE ANTACID 500 MG PO CHEW
2.0000 | CHEWABLE_TABLET | ORAL | Status: DC | PRN
  Administered 2012-05-01: 400 mg via ORAL
  Filled 2012-04-30: qty 2

## 2012-04-30 MED ORDER — ACETAMINOPHEN 325 MG PO TABS: 650.0000 mg | ORAL_TABLET | ORAL | Status: DC | PRN

## 2012-04-30 MED ORDER — ZOLPIDEM TARTRATE 10 MG PO TABS: 10.0000 mg | ORAL_TABLET | Freq: Every evening | ORAL | Status: DC | PRN

## 2012-04-30 MED ORDER — PRENATAL MULTIVITAMIN CH
1.0000 | ORAL_TABLET | Freq: Every day | ORAL | Status: DC
  Administered 2012-05-01: 1 via ORAL
  Filled 2012-04-30: qty 1

## 2012-04-30 MED ORDER — METOPROLOL SUCCINATE ER 100 MG PO TB24
200.0000 mg | ORAL_TABLET | Freq: Every day | ORAL | Status: DC
  Administered 2012-05-01: 200 mg via ORAL
  Filled 2012-04-30 (×2): qty 2

## 2012-04-30 MED ORDER — LACTATED RINGERS IV SOLN
INTRAVENOUS | Status: DC
  Administered 2012-04-30 – 2012-05-02 (×3): via INTRAVENOUS

## 2012-04-30 NOTE — MAU Note (Signed)
Patient to MAU for evaluation of elevated blood pressure, Denies any painful contractions, leaking and bleeding and reports good fetal movement.

## 2012-04-30 NOTE — H&P (Signed)
History   31 yo G4P2102 MBF w/ known chronic HTN on med , previous C/S x 3 now @ 38 3/[redacted] weeks gestation sent to MAU 2nd to elev BP in office, 2+ edema, 7lb weight gain in one week. Pt denies h/a, visual changes or epigastric pain. Pt has been compliant with med. BP in office 158/100.  Chief Complaint  Patient presents with  . Hypertension     OB History    Grav Para Term Preterm Abortions TAB SAB Ect Mult Living   4 3 2 1      2       Past Medical History  Diagnosis Date  . Hypertension   . Endometriosis     Past Surgical History  Procedure Date  . Cesarean section   . Laparoscopy     History reviewed. No pertinent family history.  History  Substance Use Topics  . Smoking status: Never Smoker   . Smokeless tobacco: Never Used  . Alcohol Use: No    Allergies: No Known Allergies  Prescriptions prior to admission  Medication Sig Dispense Refill  . metoprolol succinate (TOPROL-XL) 100 MG 24 hr tablet Take 100 mg by mouth daily. Take with or immediately following a meal.      . NIFEdipine (PROCARDIA XL/ADALAT-CC) 60 MG 24 hr tablet Take 60 mg by mouth daily.      . potassium chloride SA (K-DUR,KLOR-CON) 20 MEQ tablet Take 20 mEq by mouth daily.      . Prenatal MV-Min-Fe Fum-FA-DHA [facility] PRENATAL EARLY SHIELD) 27-0.8 & 200 MG MISC Take 1 tablet by mouth every morning.         Physical Exam   Blood pressure 170/97, pulse 72, temperature 99.1 F (37.3 C), temperature source Oral, resp. rate 20, height 5\' 4"  (1.626 m), weight 123.106 kg (271 lb 6.4 oz), SpO2 100.00%.  General appearance: alert, cooperative and no distress Lungs: clear to auscultation bilaterally Breasts: normal appearance, no masses or tenderness, Normal to palpation without dominant masses Heart: regular rate and rhythm, S1, S2 normal, no murmur, click, rub or gallop Abdomen: gravid (+) pfannensteil incision obese Pelvic: external genitalia normal and closed/60% OOP Extremities: edema 2+ Skin:  Skin color, texture, turgor normal. No rashes or lesions ED Course  PIH labs pending  IMP: chronic HTN r/o superimposed severe preeclampsia Previous C/S x 3 IUP @ 38 3/7 week  P) PIH labs. Cont monitor MDM  Marcelina Mclaurin A, MD 6:49 PM 04/30/2012

## 2012-04-30 NOTE — MAU Note (Signed)
Patient is scheduled for a repeat cesarean section on 7-3.  

## 2012-05-01 ENCOUNTER — Inpatient Hospital Stay (HOSPITAL_COMMUNITY): Admission: RE | Admit: 2012-05-01 | Payer: BC Managed Care – PPO | Source: Ambulatory Visit

## 2012-05-01 DIAGNOSIS — O119 Pre-existing hypertension with pre-eclampsia, unspecified trimester: Secondary | ICD-10-CM | POA: Diagnosis present

## 2012-05-01 LAB — CREATININE CLEARANCE, URINE, 24 HOUR
Collection Interval-CRCL: 24 hours
Creatinine Clearance: 175 mL/min — ABNORMAL HIGH (ref 75–115)
Creatinine, 24H Ur: 1689 mg/d (ref 700–1800)
Creatinine, Urine: 54.49 mg/dL
Creatinine: 0.67 mg/dL (ref 0.50–1.10)

## 2012-05-01 LAB — CBC
HCT: 34.4 % — ABNORMAL LOW (ref 36.0–46.0)
Hemoglobin: 11.4 g/dL — ABNORMAL LOW (ref 12.0–15.0)
MCH: 30.5 pg (ref 26.0–34.0)
MCV: 92 fL (ref 78.0–100.0)
Platelets: 241 10*3/uL (ref 150–400)
RBC: 3.74 MIL/uL — ABNORMAL LOW (ref 3.87–5.11)
WBC: 9.3 10*3/uL (ref 4.0–10.5)

## 2012-05-01 LAB — COMPREHENSIVE METABOLIC PANEL
ALT: 16 U/L (ref 0–35)
AST: 26 U/L (ref 0–37)
Albumin: 2.6 g/dL — ABNORMAL LOW (ref 3.5–5.2)
CO2: 21 mEq/L (ref 19–32)
Calcium: 8.6 mg/dL (ref 8.4–10.5)
Creatinine, Ser: 0.67 mg/dL (ref 0.50–1.10)
Sodium: 137 mEq/L (ref 135–145)
Total Protein: 6.3 g/dL (ref 6.0–8.3)

## 2012-05-01 LAB — PREPARE RBC (CROSSMATCH)

## 2012-05-01 LAB — ABO/RH: ABO/RH(D): O POS

## 2012-05-01 MED ORDER — LABETALOL HCL 5 MG/ML IV SOLN
10.0000 mg | Freq: Once | INTRAVENOUS | Status: AC
  Administered 2012-05-01: 10 mg via INTRAVENOUS
  Filled 2012-05-01: qty 4

## 2012-05-01 MED ORDER — PANTOPRAZOLE SODIUM 40 MG PO TBEC
40.0000 mg | DELAYED_RELEASE_TABLET | Freq: Every day | ORAL | Status: DC
  Administered 2012-05-01: 40 mg via ORAL
  Filled 2012-05-01 (×2): qty 1

## 2012-05-01 NOTE — Progress Notes (Signed)
UR Chart review completed.  

## 2012-05-01 NOTE — Progress Notes (Signed)
Patient ID: [name], female   DOB: [birth year], 31 y.o.   MRN: [id number]  S:  Pt feeling well.  HA improved; no blurred vision or scotomas, no epigastric pain Good FM's, No UC's   O: A&O x 3 Heart: RRR Lungs: CTA Abd: gravid, non tender LE's +3 pedal and pretib edema; no calf pain, SCD's in place.  FHT 140's to 150's; No UC's  BP range 143/80 - 170/97, with most recent BP 143/80 since toprol dose increased  24 hr urine pending Ur Acid: 5.7 LFT's WNL Hgb: 11.4/Hct: 34.4 Plts: 241  A:  38wks IUP with Chronic HTN Possible preeclampsia, remains on Mag Sulfate Hx previous C/S x 3   P: 24hr urine pending Dr Cherly Hensen to evaluate later today   Stephanie Hays, [facility] 05/01/12 1045am

## 2012-05-02 ENCOUNTER — Encounter (HOSPITAL_COMMUNITY): Payer: Self-pay | Admitting: Anesthesiology

## 2012-05-02 ENCOUNTER — Inpatient Hospital Stay (HOSPITAL_COMMUNITY): Payer: BC Managed Care – PPO | Admitting: Anesthesiology

## 2012-05-02 ENCOUNTER — Encounter (HOSPITAL_COMMUNITY): Payer: Self-pay | Admitting: *Deleted

## 2012-05-02 ENCOUNTER — Encounter (HOSPITAL_COMMUNITY)
Admission: AD | Disposition: A | Discharge status: Home / Self Care | Payer: Self-pay | Source: Ambulatory Visit | Attending: Obstetrics and Gynecology

## 2012-05-02 LAB — MRSA PCR SCREENING: MRSA by PCR: NEGATIVE

## 2012-05-02 LAB — CBC
Hemoglobin: 12.7 g/dL (ref 12.0–15.0)
MCV: 92.2 fL (ref 78.0–100.0)
Platelets: 261 10*3/uL (ref 150–400)
Platelets: 231 10*3/uL (ref 150–400)
RBC: 4.1 MIL/uL (ref 3.87–5.11)
RBC: 3.84 MIL/uL — ABNORMAL LOW (ref 3.87–5.11)
RDW: 14.2 % (ref 11.5–15.5)
WBC: 9.2 10*3/uL (ref 4.0–10.5)
WBC: 9.3 10*3/uL (ref 4.0–10.5)

## 2012-05-02 LAB — PROTEIN, URINE, 24 HOUR
Collection Interval-UPROT: 24 hours
Protein, 24H Urine: 589 mg/d — ABNORMAL HIGH (ref 50–100)
Protein, Urine: 19 mg/dL

## 2012-05-02 LAB — RPR: RPR Ser Ql: NONREACTIVE

## 2012-05-02 LAB — MAGNESIUM
Magnesium: 4.2 mg/dL — ABNORMAL HIGH (ref 1.5–2.5)
Magnesium: 4.1 mg/dL — ABNORMAL HIGH (ref 1.5–2.5)

## 2012-05-02 SURGERY — Surgical Case
Anesthesia: Spinal | Site: Abdomen | Wound class: Clean Contaminated

## 2012-05-02 MED ORDER — METOPROLOL SUCCINATE ER 100 MG PO TB24
100.0000 mg | ORAL_TABLET | Freq: Every day | ORAL | Status: DC
  Administered 2012-05-03 – 2012-05-06 (×4): 100 mg via ORAL
  Filled 2012-05-02 (×5): qty 1

## 2012-05-02 MED ORDER — NALBUPHINE HCL 10 MG/ML IJ SOLN
5.0000 mg | INTRAMUSCULAR | Status: DC | PRN
  Filled 2012-05-02: qty 1

## 2012-05-02 MED ORDER — WITCH HAZEL-GLYCERIN EX PADS: 1.0000 "application " | MEDICATED_PAD | CUTANEOUS | Status: DC | PRN

## 2012-05-02 MED ORDER — NIFEDIPINE ER 60 MG PO TB24
60.0000 mg | ORAL_TABLET | Freq: Every day | ORAL | Status: DC
  Administered 2012-05-02: 60 mg via ORAL
  Filled 2012-05-02: qty 1

## 2012-05-02 MED ORDER — IBUPROFEN 600 MG PO TABS
600.0000 mg | ORAL_TABLET | Freq: Four times a day (QID) | ORAL | Status: DC | PRN
  Filled 2012-05-02 (×4): qty 1

## 2012-05-02 MED ORDER — LANOLIN HYDROUS EX OINT: 1.0000 "application " | TOPICAL_OINTMENT | CUTANEOUS | Status: DC | PRN

## 2012-05-02 MED ORDER — BUPIVACAINE IN DEXTROSE 0.75-8.25 % IT SOLN
INTRATHECAL | Status: DC | PRN
  Administered 2012-05-02: 11.75 mg via INTRATHECAL

## 2012-05-02 MED ORDER — SODIUM CHLORIDE 0.9 % IJ SOLN: 3.0000 mL | Freq: Two times a day (BID) | INTRAMUSCULAR | Status: DC

## 2012-05-02 MED ORDER — KETOROLAC TROMETHAMINE 30 MG/ML IJ SOLN: 30.0000 mg | Freq: Four times a day (QID) | INTRAMUSCULAR | Status: DC | PRN

## 2012-05-02 MED ORDER — KETOROLAC TROMETHAMINE 60 MG/2ML IM SOLN
INTRAMUSCULAR | Status: AC
  Filled 2012-05-02: qty 2

## 2012-05-02 MED ORDER — ONDANSETRON HCL 4 MG/2ML IJ SOLN
4.0000 mg | INTRAMUSCULAR | Status: DC | PRN
  Administered 2012-05-02: 4 mg via INTRAVENOUS
  Filled 2012-05-02: qty 2

## 2012-05-02 MED ORDER — ONDANSETRON HCL 4 MG PO TABS: 4.0000 mg | ORAL_TABLET | ORAL | Status: DC | PRN

## 2012-05-02 MED ORDER — DIPHENHYDRAMINE HCL 50 MG/ML IJ SOLN: 25.0000 mg | INTRAMUSCULAR | Status: DC | PRN

## 2012-05-02 MED ORDER — SODIUM CHLORIDE 0.9 % IV SOLN: 250.0000 mL | INTRAVENOUS | Status: DC

## 2012-05-02 MED ORDER — OXYTOCIN 10 UNIT/ML IJ SOLN
INTRAMUSCULAR | Status: AC
  Filled 2012-05-02: qty 4

## 2012-05-02 MED ORDER — MEPERIDINE HCL 25 MG/ML IJ SOLN: 6.2500 mg | INTRAMUSCULAR | Status: DC | PRN

## 2012-05-02 MED ORDER — BUPIVACAINE HCL (PF) 0.25 % IJ SOLN
INTRAMUSCULAR | Status: DC | PRN
  Administered 2012-05-02: 6 mL

## 2012-05-02 MED ORDER — MAGNESIUM SULFATE 40 G IN LACTATED RINGERS - SIMPLE
2.0000 g/h | INTRAVENOUS | Status: DC
  Administered 2012-05-02 – 2012-05-04 (×4): 2 g/h via INTRAVENOUS
  Filled 2012-05-02 (×4): qty 500

## 2012-05-02 MED ORDER — PRENATAL MULTIVITAMIN CH
1.0000 | ORAL_TABLET | Freq: Every day | ORAL | Status: DC
  Administered 2012-05-04 – 2012-05-06 (×3): 1 via ORAL
  Filled 2012-05-02 (×3): qty 1

## 2012-05-02 MED ORDER — EPHEDRINE SULFATE 50 MG/ML IJ SOLN
INTRAMUSCULAR | Status: DC | PRN
  Administered 2012-05-02: 5 mg via INTRAVENOUS

## 2012-05-02 MED ORDER — DEXTROSE 5 % IV SOLN
2.0000 g | INTRAVENOUS | Status: AC
  Administered 2012-05-02: 2 g via INTRAVENOUS
  Filled 2012-05-02: qty 2

## 2012-05-02 MED ORDER — KETOROLAC TROMETHAMINE 60 MG/2ML IM SOLN
60.0000 mg | Freq: Once | INTRAMUSCULAR | Status: AC | PRN
  Administered 2012-05-02: 60 mg via INTRAMUSCULAR

## 2012-05-02 MED ORDER — OXYTOCIN 10 UNIT/ML IJ SOLN
40.0000 [IU] | INTRAVENOUS | Status: DC | PRN
  Administered 2012-05-02: 40 [IU] via INTRAVENOUS

## 2012-05-02 MED ORDER — NALOXONE HCL 0.4 MG/ML IJ SOLN: 0.4000 mg | INTRAMUSCULAR | Status: DC | PRN

## 2012-05-02 MED ORDER — DIPHENHYDRAMINE HCL 25 MG PO CAPS: 25.0000 mg | ORAL_CAPSULE | ORAL | Status: DC | PRN

## 2012-05-02 MED ORDER — CITRIC ACID-SODIUM CITRATE 334-500 MG/5ML PO SOLN
30.0000 mL | Freq: Once | ORAL | Status: AC
  Administered 2012-05-02: 30 mL via ORAL

## 2012-05-02 MED ORDER — BUPIVACAINE IN DEXTROSE 0.75-8.25 % IT SOLN
INTRATHECAL | Status: AC
  Filled 2012-05-02: qty 2

## 2012-05-02 MED ORDER — SODIUM CHLORIDE 0.9 % IJ SOLN: 3.0000 mL | INTRAMUSCULAR | Status: DC | PRN

## 2012-05-02 MED ORDER — METHYLERGONOVINE MALEATE 0.2 MG PO TABS
0.2000 mg | ORAL_TABLET | ORAL | Status: DC | PRN
Start: 2012-05-02 — End: 2012-05-03

## 2012-05-02 MED ORDER — PROMETHAZINE HCL 25 MG/ML IJ SOLN
25.0000 mg | Freq: Once | INTRAMUSCULAR | Status: AC
  Administered 2012-05-02: 25 mg via INTRAVENOUS
  Filled 2012-05-02: qty 1

## 2012-05-02 MED ORDER — FLEET ENEMA 7-19 GM/118ML RE ENEM: 1.0000 | ENEMA | Freq: Every day | RECTAL | Status: DC | PRN

## 2012-05-02 MED ORDER — HYDRALAZINE HCL 20 MG/ML IJ SOLN
5.0000 mg | Freq: Once | INTRAMUSCULAR | Status: AC
  Administered 2012-05-02: 5 mg via INTRAVENOUS
  Filled 2012-05-02: qty 1

## 2012-05-02 MED ORDER — FENTANYL CITRATE 0.05 MG/ML IJ SOLN
INTRAMUSCULAR | Status: AC
  Filled 2012-05-02: qty 2

## 2012-05-02 MED ORDER — LIDOCAINE HCL (PF) 1 % IJ SOLN
INTRAMUSCULAR | Status: AC
  Filled 2012-05-02: qty 5

## 2012-05-02 MED ORDER — SCOPOLAMINE 1 MG/3DAYS TD PT72
1.0000 | MEDICATED_PATCH | Freq: Once | TRANSDERMAL | Status: AC
  Administered 2012-05-02: 1.5 mg via TRANSDERMAL

## 2012-05-02 MED ORDER — BUPIVACAINE HCL (PF) 0.25 % IJ SOLN
INTRAMUSCULAR | Status: AC
  Filled 2012-05-02: qty 30

## 2012-05-02 MED ORDER — SODIUM CHLORIDE 0.9 % IV SOLN
1.0000 ug/kg/h | INTRAVENOUS | Status: DC | PRN
  Filled 2012-05-02: qty 2.5

## 2012-05-02 MED ORDER — CEFAZOLIN SODIUM 1-5 GM-% IV SOLN
1.0000 g | Freq: Three times a day (TID) | INTRAVENOUS | Status: AC
  Administered 2012-05-02 (×2): 1 g via INTRAVENOUS
  Filled 2012-05-02 (×2): qty 50

## 2012-05-02 MED ORDER — ONDANSETRON HCL 4 MG/2ML IJ SOLN
INTRAMUSCULAR | Status: DC | PRN
  Administered 2012-05-02: 4 mg via INTRAVENOUS

## 2012-05-02 MED ORDER — ONDANSETRON HCL 4 MG/2ML IJ SOLN: 4.0000 mg | Freq: Three times a day (TID) | INTRAMUSCULAR | Status: DC | PRN

## 2012-05-02 MED ORDER — MORPHINE SULFATE (PF) 0.5 MG/ML IJ SOLN
INTRAMUSCULAR | Status: DC | PRN
  Administered 2012-05-02: .1 mg via INTRATHECAL

## 2012-05-02 MED ORDER — DIPHENHYDRAMINE HCL 25 MG PO CAPS
25.0000 mg | ORAL_CAPSULE | Freq: Four times a day (QID) | ORAL | Status: DC | PRN
Start: 2012-05-02 — End: 2012-05-06

## 2012-05-02 MED ORDER — SCOPOLAMINE 1 MG/3DAYS TD PT72
MEDICATED_PATCH | TRANSDERMAL | Status: AC
  Filled 2012-05-02: qty 1

## 2012-05-02 MED ORDER — OXYTOCIN 40 UNITS IN LACTATED RINGERS INFUSION - SIMPLE MED
62.5000 mL/h | INTRAVENOUS | Status: AC
  Filled 2012-05-02: qty 1000

## 2012-05-02 MED ORDER — METOPROLOL SUCCINATE ER 100 MG PO TB24
200.0000 mg | ORAL_TABLET | Freq: Every day | ORAL | Status: DC
  Administered 2012-05-02: 200 mg via ORAL
  Filled 2012-05-02: qty 2

## 2012-05-02 MED ORDER — METHYLERGONOVINE MALEATE 0.2 MG/ML IJ SOLN
0.2000 mg | INTRAMUSCULAR | Status: DC | PRN
Start: 2012-05-02 — End: 2012-05-03

## 2012-05-02 MED ORDER — SODIUM CHLORIDE 0.9 % IR SOLN
Status: DC | PRN
  Administered 2012-05-02: 1000 mL

## 2012-05-02 MED ORDER — BISACODYL 10 MG RE SUPP: 10.0000 mg | Freq: Every day | RECTAL | Status: DC | PRN

## 2012-05-02 MED ORDER — ZOLPIDEM TARTRATE 5 MG PO TABS: 5.0000 mg | ORAL_TABLET | Freq: Every evening | ORAL | Status: DC | PRN

## 2012-05-02 MED ORDER — TETANUS-DIPHTH-ACELL PERTUSSIS 5-2.5-18.5 LF-MCG/0.5 IM SUSP
0.5000 mL | Freq: Once | INTRAMUSCULAR | Status: AC
  Administered 2012-05-03: 0.5 mL via INTRAMUSCULAR
  Filled 2012-05-02: qty 0.5

## 2012-05-02 MED ORDER — IBUPROFEN 600 MG PO TABS
600.0000 mg | ORAL_TABLET | Freq: Four times a day (QID) | ORAL | Status: DC
  Administered 2012-05-02 – 2012-05-06 (×15): 600 mg via ORAL
  Filled 2012-05-02 (×12): qty 1

## 2012-05-02 MED ORDER — PHENYLEPHRINE HCL 10 MG/ML IJ SOLN
INTRAMUSCULAR | Status: DC | PRN
  Administered 2012-05-02 (×2): 40 ug via INTRAVENOUS
  Administered 2012-05-02: 80 ug via INTRAVENOUS
  Administered 2012-05-02: 40 ug via INTRAVENOUS
  Administered 2012-05-02 (×4): 80 ug via INTRAVENOUS
  Administered 2012-05-02: 40 ug via INTRAVENOUS
  Administered 2012-05-02: 80 ug via INTRAVENOUS
  Administered 2012-05-02: 40 ug via INTRAVENOUS

## 2012-05-02 MED ORDER — SIMETHICONE 80 MG PO CHEW: 80.0000 mg | CHEWABLE_TABLET | ORAL | Status: DC | PRN

## 2012-05-02 MED ORDER — DEXTROSE IN LACTATED RINGERS 5 % IV SOLN
INTRAVENOUS | Status: DC
  Administered 2012-05-03 (×2): via INTRAVENOUS

## 2012-05-02 MED ORDER — LACTATED RINGERS IV SOLN
INTRAVENOUS | Status: DC | PRN
  Administered 2012-05-02: 08:00:00 via INTRAVENOUS

## 2012-05-02 MED ORDER — SIMETHICONE 80 MG PO CHEW
80.0000 mg | CHEWABLE_TABLET | Freq: Three times a day (TID) | ORAL | Status: DC
  Administered 2012-05-02 – 2012-05-06 (×14): 80 mg via ORAL

## 2012-05-02 MED ORDER — METOCLOPRAMIDE HCL 5 MG/ML IJ SOLN: 10.0000 mg | Freq: Three times a day (TID) | INTRAMUSCULAR | Status: DC | PRN

## 2012-05-02 MED ORDER — MORPHINE SULFATE 0.5 MG/ML IJ SOLN
INTRAMUSCULAR | Status: AC
  Filled 2012-05-02: qty 10

## 2012-05-02 MED ORDER — OXYCODONE-ACETAMINOPHEN 5-325 MG PO TABS
1.0000 | ORAL_TABLET | ORAL | Status: DC | PRN
Start: 2012-05-02 — End: 2012-05-06

## 2012-05-02 MED ORDER — SENNOSIDES-DOCUSATE SODIUM 8.6-50 MG PO TABS
2.0000 | ORAL_TABLET | Freq: Every day | ORAL | Status: DC
  Administered 2012-05-02 – 2012-05-05 (×4): 2 via ORAL

## 2012-05-02 MED ORDER — DIPHENHYDRAMINE HCL 50 MG/ML IJ SOLN: 12.5000 mg | INTRAMUSCULAR | Status: DC | PRN

## 2012-05-02 MED ORDER — POTASSIUM CHLORIDE CRYS ER 20 MEQ PO TBCR
20.0000 meq | EXTENDED_RELEASE_TABLET | Freq: Every day | ORAL | Status: DC
  Administered 2012-05-02 – 2012-05-06 (×5): 20 meq via ORAL
  Filled 2012-05-02 (×6): qty 1

## 2012-05-02 MED ORDER — FENTANYL CITRATE 0.05 MG/ML IJ SOLN: 25.0000 ug | INTRAMUSCULAR | Status: DC | PRN

## 2012-05-02 MED ORDER — ONDANSETRON HCL 4 MG/2ML IJ SOLN
INTRAMUSCULAR | Status: AC
  Filled 2012-05-02: qty 2

## 2012-05-02 MED ORDER — MENTHOL 3 MG MT LOZG: 1.0000 | LOZENGE | OROMUCOSAL | Status: DC | PRN

## 2012-05-02 MED ORDER — PROMETHAZINE HCL 25 MG/ML IJ SOLN: 25.0000 mg | Freq: Four times a day (QID) | INTRAMUSCULAR | Status: DC | PRN

## 2012-05-02 MED ORDER — FENTANYL CITRATE 0.05 MG/ML IJ SOLN
INTRAMUSCULAR | Status: DC | PRN
  Administered 2012-05-02: 15 ug via INTRATHECAL

## 2012-05-02 MED ORDER — NIFEDIPINE ER 60 MG PO TB24
60.0000 mg | ORAL_TABLET | Freq: Every day | ORAL | Status: DC
  Administered 2012-05-03 – 2012-05-06 (×4): 60 mg via ORAL
  Filled 2012-05-02 (×7): qty 1

## 2012-05-02 MED ORDER — DIBUCAINE 1 % RE OINT: 1.0000 "application " | TOPICAL_OINTMENT | RECTAL | Status: DC | PRN

## 2012-05-02 SURGICAL SUPPLY — 45 items
APL SKNCLS STERI-STRIP NONHPOA (GAUZE/BANDAGES/DRESSINGS) ×1
BARRIER ADHS 3X4 INTERCEED (GAUZE/BANDAGES/DRESSINGS) ×1 IMPLANT
BENZOIN TINCTURE PRP APPL 2/3 (GAUZE/BANDAGES/DRESSINGS) ×1 IMPLANT
BRR ADH 4X3 ABS CNTRL BYND (GAUZE/BANDAGES/DRESSINGS) ×1
CHLORAPREP W/TINT 26ML (MISCELLANEOUS) ×2 IMPLANT
CLOTH BEACON ORANGE TIMEOUT ST (SAFETY) ×2 IMPLANT
CONTAINER PREFILL 10% NBF 15ML (MISCELLANEOUS) IMPLANT
DRESSING TELFA 8X3 (GAUZE/BANDAGES/DRESSINGS) ×1 IMPLANT
DRSG COVADERM 4X10 (GAUZE/BANDAGES/DRESSINGS) ×1 IMPLANT
ELECT REM PT RETURN 9FT ADLT (ELECTROSURGICAL) ×2
ELECTRODE REM PT RTRN 9FT ADLT (ELECTROSURGICAL) ×1 IMPLANT
EXTRACTOR VACUUM KIWI (MISCELLANEOUS) IMPLANT
EXTRACTOR VACUUM M CUP 4 TUBE (SUCTIONS) IMPLANT
GAUZE SPONGE 4X4 12PLY STRL LF (GAUZE/BANDAGES/DRESSINGS) ×2 IMPLANT
GLOVE BIO SURGEON STRL SZ 6.5 (GLOVE) ×3 IMPLANT
GLOVE BIOGEL PI IND STRL 7.0 (GLOVE) ×2 IMPLANT
GLOVE BIOGEL PI INDICATOR 7.0 (GLOVE) ×3
GOWN PREVENTION PLUS LG XLONG (DISPOSABLE) ×6 IMPLANT
KIT ABG SYR 3ML LUER SLIP (SYRINGE) IMPLANT
NDL HYPO 25X1 1.5 SAFETY (NEEDLE) ×1 IMPLANT
NDL HYPO 25X5/8 SAFETYGLIDE (NEEDLE) IMPLANT
NEEDLE HYPO 25X1 1.5 SAFETY (NEEDLE) ×2 IMPLANT
NEEDLE HYPO 25X5/8 SAFETYGLIDE (NEEDLE) IMPLANT
NS IRRIG 1000ML POUR BTL (IV SOLUTION) ×2 IMPLANT
PACK C SECTION WH (CUSTOM PROCEDURE TRAY) ×2 IMPLANT
PAD ABD 7.5X8 STRL (GAUZE/BANDAGES/DRESSINGS) ×2 IMPLANT
RTRCTR C-SECT PINK 25CM LRG (MISCELLANEOUS) IMPLANT
SLEEVE SCD COMPRESS KNEE MED (MISCELLANEOUS) ×1 IMPLANT
STAPLER VISISTAT 35W (STAPLE) ×1 IMPLANT
STRIP CLOSURE SKIN 1/2X4 (GAUZE/BANDAGES/DRESSINGS) IMPLANT
SUT CHROMIC GUT AB #0 18 (SUTURE) IMPLANT
SUT MNCRL 0 VIOLET CTX 36 (SUTURE) ×3 IMPLANT
SUT MON AB 4-0 PS1 27 (SUTURE) IMPLANT
SUT MONOCRYL 0 CTX 36 (SUTURE) ×3
SUT PLAIN 2 0 (SUTURE)
SUT PLAIN ABS 2-0 CT1 27XMFL (SUTURE) IMPLANT
SUT VIC AB 0 CT1 27 (SUTURE) ×4
SUT VIC AB 0 CT1 27XBRD ANBCTR (SUTURE) ×2 IMPLANT
SUT VIC AB 2-0 CT1 27 (SUTURE) ×2
SUT VIC AB 2-0 CT1 TAPERPNT 27 (SUTURE) ×1 IMPLANT
SYR CONTROL 10ML LL (SYRINGE) ×2 IMPLANT
TAPE CLOTH SURG 4X10 WHT LF (GAUZE/BANDAGES/DRESSINGS) ×1 IMPLANT
TOWEL OR 17X24 6PK STRL BLUE (TOWEL DISPOSABLE) ×4 IMPLANT
TRAY FOLEY CATH 14FR (SET/KITS/TRAYS/PACK) ×1 IMPLANT
WATER STERILE IRR 1000ML POUR (IV SOLUTION) ×2 IMPLANT

## 2012-05-02 NOTE — OR Nursing (Addendum)
Uterus massaged by S. Zimir Kittleson RN  100cc of blood  evacuated from uterus during uterine massaged. Two tubes of cord blood sent to lab.

## 2012-05-02 NOTE — Anesthesia Preprocedure Evaluation (Signed)
Anesthesia Evaluation  Patient identified by MRN, date of birth, ID band Patient awake    Reviewed: Allergy & Precautions, H&P , NPO status , Patient's Chart, lab work & pertinent test results, reviewed documented beta blocker date and time   History of Anesthesia Complications Negative for: history of anesthetic complications  Airway Mallampati: III TM Distance: >3 FB Neck ROM: full    Dental  (+) Teeth Intact   Pulmonary neg pulmonary ROS,  breath sounds clear to auscultation        Cardiovascular hypertension (CHTN with superimposed preeclampsia), Rhythm:regular Rate:Normal     Neuro/Psych negative neurological ROS  negative psych ROS   GI/Hepatic Neg liver ROS, GERD- (with pregnancy)  ,  Endo/Other  Morbid obesity  Renal/GU negative Renal ROS  negative genitourinary   Musculoskeletal   Abdominal   Peds  Hematology negative hematology ROS (+)   Anesthesia Other Findings   Reproductive/Obstetrics (+) Pregnancy (h/o c/s x3, for repeat c/s)                           Anesthesia Physical Anesthesia Plan  ASA: III and Emergent  Anesthesia Plan: Combined Spinal and Epidural   Post-op Pain Management:    Induction:   Airway Management Planned:   Additional Equipment:   Intra-op Plan:   Post-operative Plan:   Informed Consent: I have reviewed the patients History and Physical, chart, labs and discussed the procedure including the risks, benefits and alternatives for the proposed anesthesia with the patient or authorized representative who has indicated his/her understanding and acceptance.   Dental Advisory Given  Plan Discussed with: CRNA and Surgeon  Anesthesia Plan Comments:         Anesthesia Quick Evaluation

## 2012-05-02 NOTE — Progress Notes (Addendum)
Pt informed need repeat C-section due to severe superimposed preeclampsia.  O: UTP( 24hr) 589 mg BP 154/86 ( 152/79- 177/103) IMP: chronic HTN w/ superimposed severe preeclampsia on procardia XL and topral XL Previous C/S x3 IUP @ 38 5/7 weeks   CBC, Mag level ordered  P) Repeat C/S. Risk of C/S reviewed w/ pt: infection, bleeding, poss blood transfusion and its risk( HIV, hepatitis, fever, rash), poss C-hysterectomy, injury to surrounding organ structures. Advised will be in AICU until diuresing well. Cont BP meds. Magnesium sulfate. All ? Answered

## 2012-05-02 NOTE — Progress Notes (Signed)
05/02/12 2000  Vitals  Pulse Rate 74   BP ! 178/95 mmHg  MAP (mmHg) 117   Resp 16   Oxygen Therapy  SpO2 97 %  O2 Device None (Room air)   5mg  Apressoline given IV

## 2012-05-02 NOTE — Anesthesia Procedure Notes (Signed)
Spinal  Patient location during procedure: OR Start time: 05/02/2012 7:55 AM Staffing Performed by: anesthesiologist  Preanesthetic Checklist Completed: patient identified, site marked, surgical consent, pre-op evaluation, timeout performed, IV checked, risks and benefits discussed and monitors and equipment checked Spinal Block Patient position: sitting Prep: site prepped and draped and DuraPrep Patient monitoring: cardiac monitor, continuous pulse ox, blood pressure and heart rate Approach: midline Location: L3-4 Injection technique: catheter Needle Needle type: Tuohy and Pencan  Needle gauge: 24 G Needle length: 12.7 cm Needle insertion depth: 7 cm Catheter type: closed end flexible Catheter size: 19 g Catheter at skin depth: 13 cm Additional Notes CSE planned for 4th C-section at Dr Cousin's request.  LOR to air at 7 cm at skin, Pencan via tuohy with clear free flow CSF on first attempt, pencan removed and epidural cath passed easily.  Secured at 13 cm at skin. Patient tolerated procedure well.  Jasmine December, MD

## 2012-05-02 NOTE — Brief Op Note (Signed)
04/30/2012 - 05/02/2012  9:10 AM  PATIENT:  Stephanie Hays  31 y.o. female  PRE-OPERATIVE DIAGNOSIS:  severe superimposed pre-eclampsia/ previous cesarean section x 3, chronic HTN, IUP @ 38 5/7 week  POST-OPERATIVE DIAGNOSIS:  severe superimposed pre-eclampsia/previous cesarean section x 3, chronic HTN, IUP @ 38 5/7 week  PROCEDURE:  Procedure(s) (LRB):REPEAT LOW TRANSVERSE CESAREAN SECTION (N/A)  SURGEON:  Surgeon(s) and Role:    * Maylie Ashton Cathie Beams, MD - Primary  PHYSICIAN ASSISTANT:   ASSISTANTS: Marlinda Mike, CNM   ANESTHESIA:   epidural and spinal FINDINGS: LIVE FEMALE CORD DRAPED NEXT TO CHEST, nl tubes, polycystic ovaries( enlarged) bilaterally, thin LUS EBL:  Total I/O In: 2400 [I.V.:2400] Out: 650 [Urine:150; Blood:500]  BLOOD ADMINISTERED:none  DRAINS: none   LOCAL MEDICATIONS USED:  MARCAINE     SPECIMEN:  Source of Specimen:  placenta  DISPOSITION OF SPECIMEN:  N/A  COUNTS:  YES  TOURNIQUET:  * No tourniquets in log *  DICTATION: .Other Dictation: Dictation Number   PLAN OF CARE: Admit to inpatient   PATIENT DISPOSITION:  PACU - hemodynamically stable.   Delay start of Pharmacological VTE agent (>24hrs) due to surgical blood loss or risk of bleeding: no

## 2012-05-02 NOTE — Consult Note (Signed)
Neonatology Note:  Attendance at C-section:  I was asked to attend this repeat C/S at 38 5/7 weeks due to worsening PIH. The mother is a G4P3 O pos, GBS neg with chronic HTN with superimposed PIH, on Procardia, Metoprolol, and magnesium sulfate. ROM at delivery, fluid clear. Infant vigorous with good spontaneous cry and tone. Needed only minimal bulb suctioning. Ap 9/10. Lungs clear to ausc in DR. To CN to care of Pediatrician.  Tavonna Worthington, MD  

## 2012-05-02 NOTE — Anesthesia Postprocedure Evaluation (Signed)
Anesthesia Post Note  Patient: Stephanie Hays  Procedure(s) Performed: Procedure(s) (LRB): CESAREAN SECTION (N/A)  Anesthesia type: Combined Spinal/Epidural  Patient location: PACU  Post pain: Pain level controlled  Post assessment: Post-op Vital signs reviewed  Last Vitals:  Filed Vitals:   05/02/12 1030  BP: 141/78  Pulse: 66  Temp: 36.4 C  Resp: 16    Post vital signs: Reviewed  Level of consciousness: awake  Complications: No apparent anesthesia complications

## 2012-05-02 NOTE — Progress Notes (Signed)
Pt had combined anesthesia- epidural-spinal

## 2012-05-02 NOTE — Anesthesia Postprocedure Evaluation (Signed)
  Anesthesia Post-op Note  Patient: Stephanie Hays  Procedure(s) Performed: Procedure(s) (LRB): CESAREAN SECTION (N/A)  Patient Location: PACU and A-ICU  Anesthesia Type: Spinal  Level of Consciousness: awake, alert  and oriented  Airway and Oxygen Therapy: Patient Spontanous Breathing    Post-op Assessment: Patient's Cardiovascular Status Stable and Respiratory Function Stable  Post-op Vital Signs: stable  Complications: No apparent anesthesia complications

## 2012-05-02 NOTE — Addendum Note (Signed)
Addendum  created 05/02/12 1956 by Len Blalock, CRNA   Modules edited:Notes Section

## 2012-05-02 NOTE — Transfer of Care (Signed)
Immediate Anesthesia Transfer of Care Note  Patient: Stephanie Hays  Procedure(s) Performed: Procedure(s) (LRB): CESAREAN SECTION (N/A)  Patient Location: PACU  Anesthesia Type: Regional  Level of Consciousness: awake, alert  and oriented  Airway & Oxygen Therapy: Patient Spontanous Breathing  Post-op Assessment: Report given to PACU RN and Post -op Vital signs reviewed and stable  Post vital signs: Reviewed and stable  Complications: No apparent anesthesia complications

## 2012-05-02 NOTE — Progress Notes (Signed)
Pt with arm bent breastfeeding; will recheck BP after breastfeeding complete

## 2012-05-03 LAB — COMPREHENSIVE METABOLIC PANEL
ALT: 16 U/L (ref 0–35)
AST: 33 U/L (ref 0–37)
Albumin: 2.4 g/dL — ABNORMAL LOW (ref 3.5–5.2)
Alkaline Phosphatase: 93 U/L (ref 39–117)
Calcium: 8 mg/dL — ABNORMAL LOW (ref 8.4–10.5)
Potassium: 3.7 mEq/L (ref 3.5–5.1)
Sodium: 137 mEq/L (ref 135–145)
Total Protein: 5.5 g/dL — ABNORMAL LOW (ref 6.0–8.3)

## 2012-05-03 LAB — CBC
Hemoglobin: 10.7 g/dL — ABNORMAL LOW (ref 12.0–15.0)
MCH: 30.7 pg (ref 26.0–34.0)
MCHC: 33.5 g/dL (ref 30.0–36.0)
Platelets: 226 10*3/uL (ref 150–400)
RDW: 14.4 % (ref 11.5–15.5)

## 2012-05-03 NOTE — Progress Notes (Signed)
Patient ID: Stephanie Hays, female   DOB: 05-15-1981, 31 y.o.   MRN: [id number]   POSTOPERATIVE DAY # 1 S/P cesarean section / severe preeclampsia    S:         Reports feeling better today             Tolerating po intake / nausea and vomiting yesterday that resolved after zofran and phenergan / no nausea or vomiting this am / + flatus / no BM             Bleeding is light             Pain controlled with motrin and percocet             Up ad lib / ambulatory  Newborn breast-feeding  / Circumcision planned tomorrow   O:  A & O x 3              VS: Blood pressure 143/78, pulse 84, temperature 98.8 F (37.1 C), temperature source Oral, resp. rate 18, height 5\' 4"  (1.626 m), weight 117.935 kg (260 lb), SpO2 99.00%, unknown if currently breastfeeding.              Blood pressure range: 142-173 / 78-101   LABS: WBC/Hgb/Hct/Plts:  12.6/10.7/31.9/226 (06/30 0500)                         Creatnine 0.92 / uric acid 6.5                        SGOT - 33 / SGPT - 16 / Glucose 85   I&O:  + 1827  Lungs: Clear and unlabored  Heart: regular rate and rhythm / no mumurs  Abdomen: soft, non-tender, non-distended, hypoactive bowel sounds             Fundus: firm, non-tender, Ueven             Dressing intact            Lochia: light  Extremities: trace edema, no calf pain or tenderness  A:        POD # 1 S/P cesarean section            Chronic hypertension with superimposed severe preeclampsia             P:        Routine postoperative care              Continue Toprol and procardia - no dose chaneg             continue magnesium sulfate until diuresis     Marlinda Mike CNM, MSN 05/03/2012, 10:44 AM

## 2012-05-03 NOTE — Progress Notes (Signed)
05/03/12 1317  Vitals  Pulse Rate ! 111   BP 127/65 mmHg  MAP (mmHg) 78   Resp 20   Oxygen Therapy  SpO2 95 %  O2 Device None (Room air)   pt reports feeling "dizzy" and "not so well"-denies pain except for typical incisional pain. Abdomen soft and non tender. Stephanie Hays, CNM made aware. Will continue to monitor closely. Emotional support provided.

## 2012-05-03 NOTE — Progress Notes (Addendum)
When asked pt if she is feeling better, she states "Getting there".

## 2012-05-03 NOTE — Progress Notes (Signed)
Pt up to BR, then breastfeeding

## 2012-05-04 LAB — TYPE AND SCREEN: Unit division: 0

## 2012-05-04 MED ORDER — DEXTROSE IN LACTATED RINGERS 5 % IV SOLN
INTRAVENOUS | Status: DC
  Administered 2012-05-04: 14:00:00 via INTRAVENOUS

## 2012-05-04 NOTE — Progress Notes (Signed)
UR chart review completed.  

## 2012-05-04 NOTE — Op Note (Signed)
NAME:  Stephanie Hays, Stephanie Hays                      ACCOUNT NO.:  MEDICAL RECORD NO.:  [id number]  LOCATION:                                 FACILITY:  PHYSICIAN:  Maxie Better, M.D.DATE OF BIRTH:  02-18-1981  DATE OF PROCEDURE:  05/02/2012 DATE OF DISCHARGE:                              OPERATIVE REPORT   PREOPERATIVE DIAGNOSES:  Severe superimposed preeclampsia, chronic hypertension, previous cesarean section x3, intrauterine gestation at 16- 5/7 weeks.  PROCEDURES:  A repeat cesarean section, Kerr hysterotomy.  POSTOPERATIVE DIAGNOSES:  Previous cesarean section x3, severe superimposed preeclampsia, chronic hypertension, intrauterine gestation at 38-5/7 weeks.  ANESTHESIA:  Spinal/epidural.  SURGEON:  Maxie Better, M.D.  ASSISTANT:  Marlinda Mike, C.N.M.  PROCEDURE:  Under adequate combined spinal epidural anesthesia, the patient was placed in the supine position with a left lateral tilt.  She was sterilely prepped and draped in the usual fashion.  An indwelling Foley catheter was sterilely placed.  0.25% Marcaine was injected along the previous Pfannenstiel skin incision.  Pfannenstiel skin incision was then made, carried down to the rectus fascia.  The rectus fascia was then opened  transversely.  The rectus fascia then bluntly and sharply dissected off the rectus muscle in superior and inferior fashion.  The rectus muscle was split in the midline.  The parietal peritoneum was entered sharply and extended.  The vesicouterine peritoneum was inspected, the lower uterine segment was very thin, the bladder could not be fully developed.  Vesicouterine peritoneum was opened transversely but minimal descent of the bladder.  One scoring of the lower uterine segment resulted in the amniotic sac being seen, and the incision extended with bandage scissors bilaterally.  Artificial rupture of membranes occurred.  Subsequent delivery of a live female from the left occiput posterior  position with cord loop next to the chest was accomplished.  The baby was bulb suctioned in the abdomen.  Cord was clamped and cut.  The baby was transferred to the awaiting pediatrician who assigned Apgars of 9 and 9 at one and five minutes.  The placenta was manually removed.  Uterine cavity was cleaned of debris.  Uterine incision had no extension.  The uterine incision was closed in 2 layers, the first layer with 0 Monocryl in running locked stitch.  Prior to putting the second layer closure, there was a pumping vessel noted on the left angle which was secured with 0 Monocryl figure-of-eight sutures x2.  Subsequently, the second imbricating layer of 0 Monocryl was then placed.  The tubes were noted to be normal bilaterally.  The left tube had a paratubal cyst that was not removed, large polycystic ovaries were noted bilaterally.  The abdomen was copiously irrigated and suctioned the debris.  The paracolic gutters were cleaned of debris.  Interceed was placed overlying the incision as well as in the midline of the uterus to decrease adhesions.  The parietal peritoneum was then closed with 2-0 Vicryl.  The rectus fascia was closed with 0 Vicryl x2.  The subcutaneous area was irrigated, small bleeders cauterized, interrupted 2-0 plain sutures placed and the skin approximated with Ethicon staples.  SPECIMEN:  Placenta, not sent to  pathology.  ESTIMATED BLOOD LOSS:  500 mL.  INTRAOPERATIVE FLUID:  2 L.  URINE OUTPUT:  650 mL clear urine.  Sponge and instrument counts x2 were correct.  COMPLICATION:  None.  The patient tolerated procedure well, was transferred to recovery room in stable condition.     Maxie Better, M.D.     Amelia/MEDQ  D:  05/02/2012  T:  05/02/2012  Job:  [id number]

## 2012-05-04 NOTE — Progress Notes (Signed)
SVD: repeat C-section POD #2  S:  Pt reports feeling well / Tolerating po/ Foley / No n/v/ Bleeding is spotting/ Pain controlled withnarcotic analgesics including Percocet    O:  A & O x 3 / VS: BP 143-161/83-92 Blood pressure 153/88, pulse 65, temperature 98.6 F (37 C), temperature source Oral, resp. rate 18, height 5\' 4"  (1.626 m), weight 120.339 kg (265 lb 4.8 oz), SpO2 97.00%, unknown if currently breastfeeding.  LABS: No results found for this or any previous visit (from the past 24 hour(s)).  I&O: I/O last 3 completed shifts: In: 6297.5 [P.O.:2400; I.V.:3897.5] Out: 4565 [Urine:4565]   Total I/O In: 645 [P.O.:120; I.V.:525] Out: 725 [Urine:725]  Lungs: chest clear, no wheezing, rales, normal symmetric air entry, Heart exam - S1, S2 normal, no murmur, no gallop, rate regular  Heart: regular rate and rhythm grade 2/6  Abdomen: soft obese uterus @ umb. Dressing dry/clean./intact  Perineum: is normal  Lochia:light  Extremities:no redness or tenderness in the calves or thighs, no edema, edema trace (+)1+ DTR 1+ (-)clonus  U.o 100 cc/hr.. Last void 525/3hr  A/P: POD # 2/PPD # 2 [id number] w/ severe superimposed preeclampsia on magnesium. Awaiting diureses. Chronic HTN on procardia and topral XL  Doing well  Continue routine post partum orders  P) check magnesium. Cont IV magnesium. Remain in AICU until diuresing. Cont BP meds. Will not adjust BP med until fully diuresed

## 2012-05-05 ENCOUNTER — Encounter (HOSPITAL_COMMUNITY): Payer: Self-pay | Admitting: Obstetrics and Gynecology

## 2012-05-05 LAB — COMPREHENSIVE METABOLIC PANEL
AST: 47 U/L — ABNORMAL HIGH (ref 0–37)
Albumin: 2.4 g/dL — ABNORMAL LOW (ref 3.5–5.2)
Alkaline Phosphatase: 89 U/L (ref 39–117)
BUN: 5 mg/dL — ABNORMAL LOW (ref 6–23)
Potassium: 3.2 mEq/L — ABNORMAL LOW (ref 3.5–5.1)
Sodium: 135 mEq/L (ref 135–145)
Total Protein: 6.1 g/dL (ref 6.0–8.3)

## 2012-05-05 LAB — CBC
HCT: 29.2 % — ABNORMAL LOW (ref 36.0–46.0)
MCH: 31.2 pg (ref 26.0–34.0)
MCV: 93.9 fL (ref 78.0–100.0)
RDW: 14.6 % (ref 11.5–15.5)
WBC: 8.4 10*3/uL (ref 4.0–10.5)

## 2012-05-05 NOTE — Progress Notes (Signed)
Pt just got back from bathroom.

## 2012-05-05 NOTE — Progress Notes (Signed)
Patient ID: Stephanie Hays, female   DOB: 14-May-1981, 31 y.o.   MRN: [id number]  POSTOPERATIVE DAY # 3 S/P cesarean section / chr HTN with severe superimposed PEC   S:         Reports feeling well / some soreness with positioning             Tolerating po intake / no nausea / no vomiting / + flatus / no BM             Bleeding is light             Pain controlled with motrin and percocet             Up ad lib / ambulatory  Newborn breast feeding  / Circumcision done   O:  A & O x 3 NAD             VS: Blood pressure 171/96, pulse 80, temperature 98.6 F (37 C), temperature source Oral, resp. rate 18, height 5\' 4"  (1.626 m), weight 120.339 kg (265 lb 4.8 oz), SpO2 98.00%, unknown if currently breastfeeding.  LABS:   magnesium level 4.2  I&O: -576  Lungs: Clear and unlabored  Heart: regular rate and rhythm / no mumurs  Abdomen: soft, non-tender, non-distended             Fundus: firm, non-tender, U-1             Dressing intact            Perineum: no edema  Lochia: scant  Extremities: trace edema / small pedal edeam, no calf pain or tenderness, neg Homans  A:        POD # 3 S/P cesarean section            Chronic hypertension - marginal control with toprol and procardia            Severe superimposed preeclampsia - improving  P:        Routine postoperative care              D/C magnesium today             Consult with MD(Cosuins) for any change of hypertensive therapy             Transfer to [employer] and remove dressing today             Continue I& Michel Harrow CNM, MSN 05/05/2012, 9:19 AM

## 2012-05-06 ENCOUNTER — Encounter (HOSPITAL_COMMUNITY): Payer: Self-pay

## 2012-05-06 ENCOUNTER — Inpatient Hospital Stay (HOSPITAL_COMMUNITY): Admit: 2012-05-06 | Payer: BC Managed Care – PPO | Admitting: Obstetrics and Gynecology

## 2012-05-06 SURGERY — Surgical Case
Anesthesia: Spinal

## 2012-05-06 MED ORDER — METOPROLOL SUCCINATE ER 100 MG PO TB24
100.0000 mg | ORAL_TABLET | Freq: Once | ORAL | Status: AC
  Administered 2012-05-06: 100 mg via ORAL
  Filled 2012-05-06: qty 1

## 2012-05-06 MED ORDER — METOPROLOL SUCCINATE ER 100 MG PO TB24
200.0000 mg | ORAL_TABLET | Freq: Every day | ORAL | Status: DC
  Filled 2012-05-06: qty 2

## 2012-05-06 MED ORDER — IBUPROFEN 600 MG PO TABS: 600.0000 mg | ORAL_TABLET | Freq: Four times a day (QID) | ORAL | Status: DC

## 2012-05-06 MED ORDER — METOPROLOL SUCCINATE ER 200 MG PO TB24: 200.0000 mg | ORAL_TABLET | Freq: Every day | ORAL | Status: DC

## 2012-05-06 MED ORDER — OXYCODONE-ACETAMINOPHEN 5-325 MG PO TABS: 1.0000 | ORAL_TABLET | ORAL | Status: DC | PRN

## 2012-05-06 NOTE — Progress Notes (Addendum)
POSTOPERATIVE DAY # 4 S/P cesarean section   S:         Reports feeling ok - wants to go home this pm              O:               VS: Blood pressure 147/76, pulse 72, temperature 98.1 F (36.7 C), temperature source Oral, resp. rate 18, height 5\' 4"  (1.626 m), weight 120.339 kg (265 lb 4.8 oz), SpO2 99.00%, unknown if currently breastfeeding.              BP range: 158/87             Small areas of eversion between staples with scant drainage            Silver nitrate to edges for hemostasis and approximation  A:        POD # 4 S/P cesarean section            Chronic hypertension - stable  P:        Routine postoperative care              Stable BP             Discharge home - BP check at [facility] Friday ( call for apt today)     Marlinda Mike CNM, MSN 05/06/2012, 1:14 PM

## 2012-05-06 NOTE — Discharge Summary (Signed)
POSTOPERATIVE DISCHARGE SUMMARY:  Patient ID: Stephanie Hays MRN: [id number] DOB/AGE: 01/18/1981 31 y.o.  Admit date: 04/30/2012 Discharge date:  05/06/2012  Admission Diagnoses:  38  5/7 weeks Chronic hypertension Superimposed severe  preeclampsia Previous C/S x 3  Discharge Diagnoses:   Term Pregnancy-delivered  POD #4 cesarean section  Chronic hypertension  superimposed severe preeclampsia - resolving  Prenatal history: [id number]   EDC : 05/11/2012, Alternate EDD Entry  Prenatal care at [facility]  Primary provider : Senon Nixon Prenatal course complicated by chronic hypertension / PEC  Prenatal Labs: ABO, Rh: O (12/05 0000) positive Antibody: NEG (06/28 0515) Rubella: Immune (12/05 0000)  RPR: NON REACTIVE (06/29 0518)  HBsAg: Negative (12/05 0000)  HIV: Non-reactive (12/05 0000)  GBS: Negative (06/07 0000)  1 hr Glucola : nl   Medical / Surgical History :  Past medical history:  Past Medical History  Diagnosis Date  . Hypertension   . Endometriosis     Past surgical history:  Past Surgical History  Procedure Date  . Cesarean section   . Laparoscopy   . Cesarean section 05/02/2012    Procedure: CESAREAN SECTION;  Surgeon: Serita Kyle, MD;  Location: [facility];  Service: Gynecology;  Laterality: N/A;  Repeat cesarean section with delivery of baby boy at 73. Apgars 9/10.    Family History: History reviewed. No pertinent family history.  Social History:  reports that she has never smoked. She has never used smokeless tobacco. She reports that she does not drink alcohol or use illicit drugs.   Allergies: Review of patient's allergies indicates no known allergies.    Current Medications at time of admission:  Prior to Admission medications   Medication Sig Start Date End Date Taking? Authorizing Provider  NIFEdipine (PROCARDIA XL/ADALAT-CC) 60 MG 24 hr tablet Take 60 mg by mouth daily.   Yes Historical Provider, MD  potassium chloride SA  (K-DUR,KLOR-CON) 20 MEQ tablet Take 20 mEq by mouth daily.   Yes Historical Provider, MD  Prenatal MV-Min-Fe Fum-FA-DHA [facility] PRENATAL EARLY SHIELD) 27-0.8 & 200 MG MISC Take 1 tablet by mouth every morning.   Yes Historical Provider, MD  metoprolol succinate (TOPROL-XL) 200 MG 24 hr tablet Take 1 tablet (200 mg total) by mouth daily. Take with or immediately following a meal. 05/06/12 05/06/13  Marlinda Mike, [facility] Course: Admit from office with increased BP - chronic hypertension on Toprol (200 mg) and Procardia XL (60mg ). Completed PIH labs and 24 hour urine for diagnosis of superimposed severe preeclampsia.    Procedures: Cesarean section delivery of Female newborn by Dr Cherly Hensen  See operative report for further details  Postoperative / postpartum course: admission to AICU for postpartum magnesium sulfate x48 until adequate diuresis / adjustment of Toprol back to 200 mg pre-admit dose.  Physical Exam:   VSS: Blood pressure 147/76, pulse 72, temperature 98.1 F (36.7 C), temperature source Oral, resp. rate 18, height 5\' 4"  (1.626 m), weight 120.339 kg (265 lb 4.8 oz), SpO2 99.00%, unknown if currently breastfeeding.  LABS:   CBC on admit : 11.4 / 34.3 / 241                CBC on day of surgery: 12.7 / 37.7 / 261 ( + hemoconcentration)               CBC postop : 9.7 / 29.3 / 231               CMP  stable : SGOT 47 / SGPT 32 / creatinine 0.85 General: NAD / decreased generalized edema Heart:RR Lungs:clear unlabored Abdomen: soft / bowel sounds / + panus without edema Extremities: 1+ dependent edema   Incision:  approximated with staples / no erythema / no ecchymosis / scant drainage Staples: intact for removal Friday at [facility]  Discharge Instructions:  Discharged Condition: stable Activity: pelvic rest Diet: low sodium diet Medications: PNV, Ibuprofen, Percocet and Iron and Procardi (60XL) and Toprol ( 200 mg) Medication List  As of 05/06/2012  1:20 PM   TAKE these medications          ibuprofen 600 MG tablet   Commonly known as: ADVIL,MOTRIN   Take 1 tablet (600 mg total) by mouth every 6 (six) hours.      metoprolol 200 MG 24 hr tablet   Commonly known as: TOPROL-XL   Take 1 tablet (200 mg total) by mouth daily. Take with or immediately following a meal.      NIFEdipine 60 MG 24 hr tablet   Commonly known as: PROCARDIA XL/ADALAT-CC   Take 60 mg by mouth daily.      oxyCODONE-acetaminophen 5-325 MG per tablet   Commonly known as: PERCOCET   Take 1-2 tablets by mouth every 3 (three) hours as needed (moderate - severe pain).      potassium chloride SA 20 MEQ tablet   Commonly known as: K-DUR,KLOR-CON   Take 20 mEq by mouth daily.      SIMILAC PRENATAL EARLY SHIELD 27-0.8 & 200 MG Misc   Take 1 tablet by mouth every morning.           Condition: stable Postpartum Instructions: refer to practice specific booklet Discharge to: home Disposition: 01-Home or Self Care Follow up :  Follow-up Information    Follow up with Estera Ozier A, MD. Schedule an appointment as soon as possible for a visit in 2 days. (6 weeks postpartum )    Contact information:   [address] [place] [address] [phone]           Signed: Marlinda Mike CNM, MSN 05/06/2012, 1:20 PM

## 2012-05-06 NOTE — Progress Notes (Signed)
POSTOPERATIVE DAY # 4 S/P cesarean section   S:         Reports feeling well - no headache / blurred vision / epigastric pain             Tolerating po intake / no nausea / no vomiting / + flatus / + BM             Bleeding is light             Pain controlled with motrin and percocet             Up ad lib / ambulatory  Newborn breast feeding     O:  A & O x 3              VS: Blood pressure 164/102, pulse 58, temperature 98.1 F (36.7 C), temperature source Oral, resp. rate 18, height 5\' 4"  (1.626 m), weight 120.339 kg (265 lb 4.8 oz), SpO2 99.00%, unknown if currently breastfeeding.              BP elevated - Dr Wonda Olds to see patient - taking 100 Toprol / hx 200 mg at home - will adjust dose today   LABS: WBC/Hgb/Hct/Plts:  8.4/9.7/29.2/231 (07/02 1230)   I&O:   Lungs: Clear and unlabored  Heart: regular rate and rhythm / no mumurs  Abdomen: soft, non-tender, non-distended, bowel sounds active             Fundus: firm, non-tender, U-1             Dressing OFF              Incision:  approximated with staples / no erythema / no ecchymosis                             + drainage - some scant bleeding around staple - dried blood on peri-pad  Perineum: no edema  Lochia: light  Extremities: trace edema, no calf pain or tenderness, negative Homans  A:        POD # 4 S/P cesarean section            Chronic hypertension with superimposed preeclampsia             Elevated blood pressure  P:        Routine postoperative care              Increase Toprol back to 200 mg dose today             Monitor BP next 12-24 hours  - consider discharge when BP stable     Stephanie Hays CNM, MSN 05/06/2012, 8:49 AM

## 2012-05-09 ENCOUNTER — Encounter (HOSPITAL_COMMUNITY): Payer: Self-pay | Admitting: *Deleted

## 2012-05-09 ENCOUNTER — Inpatient Hospital Stay (HOSPITAL_COMMUNITY)
Admission: AD | Admit: 2012-05-09 | Discharge: 2012-05-09 | Disposition: A | Discharge status: Home / Self Care | Payer: BC Managed Care – PPO | Source: Ambulatory Visit | Attending: Obstetrics & Gynecology | Admitting: Obstetrics & Gynecology

## 2012-05-09 DIAGNOSIS — O909 Complication of the puerperium, unspecified: Secondary | ICD-10-CM | POA: Insufficient documentation

## 2012-05-09 NOTE — MAU Provider Note (Signed)
History   Stephanie Hays is a 31 y.o. female here for change of wound packing. She is s/p rpt C/S 1 week ago. Seen in office yesterday for new onset of drainage at wound site. 2 small areas at midline on icision noted to be opened and draining serosanguinous fluid. One area packed w/ plain sterile tape. Drainage on ABD pad today unchanged and small amount, no odor / redness at site, no increased pain. Reports one episode of chills last night, temp 99.6 last night. Felt OK since no further chills.   Chief Complaint  Patient presents with  . Wound Check     OB History    Grav Para Term Preterm Abortions TAB SAB Ect Mult Living   4 4 3 1      3       Past Medical History  Diagnosis Date  . Hypertension   . Endometriosis     Past Surgical History  Procedure Date  . Cesarean section   . Laparoscopy   . Cesarean section 05/02/2012    Procedure: CESAREAN SECTION;  Surgeon: Serita Kyle, MD;  Location: [facility];  Service: Gynecology;  Laterality: N/A;  Repeat cesarean section with delivery of baby boy at 54. Apgars 9/10.    History reviewed. No pertinent family history.  History  Substance Use Topics  . Smoking status: Never Smoker   . Smokeless tobacco: Never Used  . Alcohol Use: No    Allergies: No Known Allergies  Prescriptions prior to admission  Medication Sig Dispense Refill  . ibuprofen (ADVIL,MOTRIN) 600 MG tablet Take 1 tablet (600 mg total) by mouth every 6 (six) hours.  30 tablet  0  . metoprolol succinate (TOPROL-XL) 200 MG 24 hr tablet Take 1 tablet (200 mg total) by mouth daily. Take with or immediately following a meal.      . NIFEdipine (PROCARDIA XL/ADALAT-CC) 60 MG 24 hr tablet Take 60 mg by mouth daily.      Marland Kitchen oxyCODONE-acetaminophen (PERCOCET) 5-325 MG per tablet Take 1-2 tablets by mouth every 3 (three) hours as needed (moderate - severe pain).  30 tablet  0  . potassium chloride SA (K-DUR,KLOR-CON) 20 MEQ tablet Take 20 mEq by mouth daily.      .  Prenatal MV-Min-Fe Fum-FA-DHA [facility] PRENATAL EARLY SHIELD) 27-0.8 & 200 MG MISC Take 1 tablet by mouth every morning.        ROS as noted above Physical Exam   Physical Exam:  Filed Vitals:   05/09/12 1221  BP: 147/93  Pulse: 77  Temp: 99.1 F (37.3 C)  Resp: 18   General: NAD Heart: RRR, no murmurs Lungs: CTA b/l  Abd: Soft, NT,  Ext: no edema Neuro: DTRs normal Skin: LST incision w/ steri strips, midline opening x 2, ~ 2 cm and 1 cm, packing removed from larger opening, fascia intact, no redness / fluctuation / foul odor Draining clear fluid    ED Course  Incision wound cleaned w/ half and half normal saline and hydrogen peroxide, usinf long q tip. Patient tolerated well. Plain tape packing used in larger opening again, wicking to outside, clean ABD to under panus   Post-op 1 week, repeat cesarean section Wound seroma, no evidence of infection at this time Patient instructed to keep area clean, change ABD pad as needed for soil.  To call if chills / fever > 100.4  F/U in office on Monday AM for wound check  Plan reviewed w. Dr. Reyne Dumas  05/09/2012 12:42 PM

## 2012-05-09 NOTE — MAU Note (Signed)
C/S last Saturday here for wound repacking.

## 2012-05-09 NOTE — MAU Provider Note (Signed)
Agree w note and plan. --V.Juliene Pina, MD

## 2014-09-05 ENCOUNTER — Encounter (HOSPITAL_COMMUNITY): Payer: Self-pay | Admitting: *Deleted

## 2015-08-22 ENCOUNTER — Other Ambulatory Visit: Payer: Self-pay | Admitting: General Surgery

## 2015-08-23 ENCOUNTER — Other Ambulatory Visit: Payer: Self-pay | Admitting: General Surgery

## 2015-08-23 DIAGNOSIS — N809 Endometriosis, unspecified: Secondary | ICD-10-CM

## 2015-08-23 DIAGNOSIS — N808 Other endometriosis: Secondary | ICD-10-CM

## 2015-08-24 ENCOUNTER — Ambulatory Visit
Admission: RE | Admit: 2015-08-24 | Discharge: 2015-08-24 | Disposition: A | Discharge status: Home / Self Care | Payer: BLUE CROSS/BLUE SHIELD | Source: Ambulatory Visit | Attending: General Surgery | Admitting: General Surgery

## 2015-08-24 DIAGNOSIS — N809 Endometriosis, unspecified: Secondary | ICD-10-CM

## 2015-08-24 DIAGNOSIS — N808 Other endometriosis: Secondary | ICD-10-CM

## 2015-08-24 MED ORDER — IOPAMIDOL (ISOVUE-300) INJECTION 61%
125.0000 mL | Freq: Once | INTRAVENOUS | Status: AC | PRN
  Administered 2015-08-24: 125 mL via INTRAVENOUS

## 2015-08-25 ENCOUNTER — Other Ambulatory Visit: Payer: Self-pay | Admitting: General Surgery

## 2015-08-25 DIAGNOSIS — N808 Other endometriosis: Secondary | ICD-10-CM

## 2015-08-25 DIAGNOSIS — N809 Endometriosis, unspecified: Secondary | ICD-10-CM

## 2015-08-28 ENCOUNTER — Ambulatory Visit: Payer: Self-pay | Admitting: General Surgery

## 2015-10-17 ENCOUNTER — Encounter (HOSPITAL_COMMUNITY): Payer: Self-pay

## 2015-10-17 ENCOUNTER — Encounter (HOSPITAL_COMMUNITY)
Admission: RE | Admit: 2015-10-17 | Discharge: 2015-10-17 | Disposition: A | Discharge status: Home / Self Care | Payer: BLUE CROSS/BLUE SHIELD | Source: Ambulatory Visit | Attending: General Surgery | Admitting: General Surgery

## 2015-10-17 DIAGNOSIS — N809 Endometriosis, unspecified: Secondary | ICD-10-CM | POA: Diagnosis not present

## 2015-10-17 DIAGNOSIS — Z01812 Encounter for preprocedural laboratory examination: Secondary | ICD-10-CM | POA: Insufficient documentation

## 2015-10-17 HISTORY — DX: Other specified postprocedural states: Z98.890

## 2015-10-17 HISTORY — DX: Headache, unspecified: R51.9

## 2015-10-17 HISTORY — DX: Nocturia: R35.1

## 2015-10-17 HISTORY — DX: Headache: R51

## 2015-10-17 HISTORY — DX: Nausea with vomiting, unspecified: R11.2

## 2015-10-17 LAB — BASIC METABOLIC PANEL
Anion gap: 6 (ref 5–15)
BUN: 13 mg/dL (ref 6–20)
CHLORIDE: 106 mmol/L (ref 101–111)
CO2: 26 mmol/L (ref 22–32)
CREATININE: 1.05 mg/dL — AB (ref 0.44–1.00)
Calcium: 9.4 mg/dL (ref 8.9–10.3)
GFR calc Af Amer: >60 mL/min (ref >60)
GFR calc non Af Amer: >60 mL/min (ref >60)
GLUCOSE: 80 mg/dL (ref 65–99)
POTASSIUM: 3.3 mmol/L — AB (ref 3.5–5.1)
Sodium: 138 mmol/L (ref 135–145)

## 2015-10-17 LAB — CBC WITH DIFFERENTIAL/PLATELET
BASOS ABS: 0 10*3/uL (ref 0.0–0.1)
BASOS PCT: 0 %
Eosinophils Absolute: 0.1 10*3/uL (ref 0.0–0.7)
Eosinophils Relative: 2 %
HEMATOCRIT: 35.7 % — AB (ref 36.0–46.0)
HEMOGLOBIN: 11.8 g/dL — AB (ref 12.0–15.0)
Lymphocytes Relative: 38 %
Lymphs Abs: 3.1 10*3/uL (ref 0.7–4.0)
MCH: 29.5 pg (ref 26.0–34.0)
MCHC: 33.1 g/dL (ref 30.0–36.0)
MCV: 89.3 fL (ref 78.0–100.0)
MONO ABS: 0.5 10*3/uL (ref 0.1–1.0)
Monocytes Relative: 6 %
NEUTROS ABS: 4.4 10*3/uL (ref 1.7–7.7)
Neutrophils Relative %: 54 %
Platelets: 371 10*3/uL (ref 150–400)
RBC: 4 MIL/uL (ref 3.87–5.11)
RDW: 13.1 % (ref 11.5–15.5)
WBC: 8.2 10*3/uL (ref 4.0–10.5)

## 2015-10-17 LAB — HCG, SERUM, QUALITATIVE: Preg, Serum: NEGATIVE

## 2015-10-17 MED ORDER — CHLORHEXIDINE GLUCONATE 4 % EX LIQD: 1.0000 "application " | Freq: Once | CUTANEOUS | Status: DC

## 2015-10-17 NOTE — Progress Notes (Addendum)
Cardiologist denies having one  [facility]   Echo denies ever having one  Stress test done > 10 yrs ago  Heart cath denies   [facility]  CXR denies having in past yr

## 2015-10-17 NOTE — Pre-Procedure Instructions (Signed)
Stephanie Hays  10/17/2015      [facility] [employer] [facility] [place],  - [address] [place] [address] Phone: [phone] Fax: [phone]    Your procedure is scheduled on Mon, Dec 19 @ 12:00 PM  [facility] Admitting at 10:00 AM  Call this number if you have problems the morning of surgery:  [phone]   Remember:  Do not eat food or drink liquids after midnight.  Take these medicines the morning of surgery with A SIP OF WATER Metoprolol(Toprol) and Nifedipine(Procardia)              Stop taking your Ibuprofen. No Goody's,BC's,Aleve,Advil,Motrin,Aspirin,Fish Oil,or any Herbal Medications.    Do not wear jewelry, make-up or nail polish.  Do not wear lotions, powders, or perfumes.  You may wear deodorant.  Do not shave 48 hours prior to surgery.    Do not bring valuables to the hospital.  [facility] is not responsible for any belongings or valuables.  Contacts, dentures or bridgework may not be worn into surgery.  Leave your suitcase in the car.  After surgery it may be brought to your room.  For patients admitted to the hospital, discharge time will be determined by your treatment team.  Patients discharged the day of surgery will not be allowed to drive home.   Special instructions:  [place] - Preparing for Surgery  Before surgery, you can play an important role.  Because skin is not sterile, your skin needs to be as free of germs as possible.  You can reduce the number of germs on you skin by washing with CHG (chlorahexidine gluconate) soap before surgery.  CHG is an antiseptic cleaner which kills germs and bonds with the skin to continue killing germs even after washing.  Please DO NOT use if you have an allergy to CHG or antibacterial soaps.  If your skin becomes reddened/irritated stop using the CHG and inform your nurse when you arrive at Short Stay.  Do not shave (including legs and underarms) for at least 48  hours prior to the first CHG shower.  You may shave your face.  Please follow these instructions carefully:   1.  Shower with CHG Soap the night before surgery and the                                morning of Surgery.  2.  If you choose to wash your hair, wash your hair first as usual with your       normal shampoo.  3.  After you shampoo, rinse your hair and body thoroughly to remove the                      Shampoo.  4.  Use CHG as you would any other liquid soap.  You can apply chg directly       to the skin and wash gently with scrungie or a clean washcloth.  5.  Apply the CHG Soap to your body ONLY FROM THE NECK DOWN.        Do not use on open wounds or open sores.  Avoid contact with your eyes,       ears, mouth and genitals (private parts).  Wash genitals (private parts)       with your normal soap.  6.  Wash thoroughly, paying  special attention to the area where your surgery        will be performed.  7.  Thoroughly rinse your body with warm water from the neck down.  8.  DO NOT shower/wash with your normal soap after using and rinsing off       the CHG Soap.  9.  Pat yourself dry with a clean towel.            10.  Wear clean pajamas.            11.  Place clean sheets on your bed the night of your first shower and do not        sleep with pets.  Day of Surgery  Do not apply any lotions/deoderants the morning of surgery.  Please wear clean clothes to the hospital/surgery center.    Please read over the following fact sheets that you were given. Pain Booklet, Coughing and Deep Breathing and Surgical Site Infection Prevention

## 2015-10-22 MED ORDER — DEXTROSE 5 % IV SOLN
3.0000 g | INTRAVENOUS | Status: AC
  Administered 2015-10-23: 3 g via INTRAVENOUS
  Filled 2015-10-22: qty 3000

## 2015-10-23 ENCOUNTER — Encounter (HOSPITAL_COMMUNITY)
Admission: RE | Disposition: A | Discharge status: Home / Self Care | Payer: Self-pay | Source: Ambulatory Visit | Attending: General Surgery

## 2015-10-23 ENCOUNTER — Ambulatory Visit (HOSPITAL_COMMUNITY): Payer: BLUE CROSS/BLUE SHIELD | Admitting: Anesthesiology

## 2015-10-23 ENCOUNTER — Encounter (HOSPITAL_COMMUNITY): Payer: Self-pay

## 2015-10-23 ENCOUNTER — Ambulatory Visit (HOSPITAL_COMMUNITY)
Admission: RE | Admit: 2015-10-23 | Discharge: 2015-10-24 | Disposition: A | Discharge status: Home / Self Care | Payer: BLUE CROSS/BLUE SHIELD | Source: Ambulatory Visit | Attending: General Surgery | Admitting: General Surgery

## 2015-10-23 DIAGNOSIS — Z6841 Body Mass Index (BMI) 40.0 and over, adult: Secondary | ICD-10-CM | POA: Insufficient documentation

## 2015-10-23 DIAGNOSIS — N806 Endometriosis in cutaneous scar: Secondary | ICD-10-CM | POA: Insufficient documentation

## 2015-10-23 DIAGNOSIS — I10 Essential (primary) hypertension: Secondary | ICD-10-CM | POA: Diagnosis not present

## 2015-10-23 DIAGNOSIS — Z79899 Other long term (current) drug therapy: Secondary | ICD-10-CM | POA: Insufficient documentation

## 2015-10-23 HISTORY — PX: ABDOMINAL WALL DEFECT REPAIR: SHX53

## 2015-10-23 HISTORY — PX: EXCISION MASS ABDOMINAL: SHX6701

## 2015-10-23 SURGERY — REPAIR, ABDOMINAL WALL
Anesthesia: General | Site: Abdomen

## 2015-10-23 MED ORDER — ONDANSETRON HCL 4 MG/2ML IJ SOLN
INTRAMUSCULAR | Status: DC | PRN
  Administered 2015-10-23: 4 mg via INTRAVENOUS

## 2015-10-23 MED ORDER — LIDOCAINE HCL (CARDIAC) 20 MG/ML IV SOLN
INTRAVENOUS | Status: AC
  Filled 2015-10-23: qty 5

## 2015-10-23 MED ORDER — POVIDONE-IODINE 10 % EX OINT
TOPICAL_OINTMENT | CUTANEOUS | Status: AC
  Filled 2015-10-23: qty 28.35

## 2015-10-23 MED ORDER — POVIDONE-IODINE 10 % EX OINT
TOPICAL_OINTMENT | CUTANEOUS | Status: DC | PRN
  Administered 2015-10-23: 1 via TOPICAL

## 2015-10-23 MED ORDER — OXYCODONE-ACETAMINOPHEN 5-325 MG PO TABS
ORAL_TABLET | ORAL | Status: AC
  Filled 2015-10-23: qty 2

## 2015-10-23 MED ORDER — LACTATED RINGERS IV SOLN
INTRAVENOUS | Status: DC
  Administered 2015-10-23 (×2): via INTRAVENOUS

## 2015-10-23 MED ORDER — BUPIVACAINE-EPINEPHRINE (PF) 0.5% -1:200000 IJ SOLN
INTRAMUSCULAR | Status: AC
  Filled 2015-10-23: qty 30

## 2015-10-23 MED ORDER — IRBESARTAN 300 MG PO TABS
300.0000 mg | ORAL_TABLET | Freq: Every day | ORAL | Status: DC
  Administered 2015-10-24: 300 mg via ORAL
  Filled 2015-10-23: qty 1

## 2015-10-23 MED ORDER — POTASSIUM CHLORIDE IN NACL 20-0.9 MEQ/L-% IV SOLN
INTRAVENOUS | Status: DC
Start: 2015-10-23 — End: 2015-10-24
  Administered 2015-10-23 – 2015-10-24 (×3): via INTRAVENOUS
  Filled 2015-10-23 (×3): qty 1000

## 2015-10-23 MED ORDER — 0.9 % SODIUM CHLORIDE (POUR BTL) OPTIME
TOPICAL | Status: DC | PRN
Start: 2015-10-23 — End: 2015-10-23
  Administered 2015-10-23 (×2): 1000 mL

## 2015-10-23 MED ORDER — PHENYLEPHRINE HCL 10 MG/ML IJ SOLN
INTRAMUSCULAR | Status: DC | PRN
  Administered 2015-10-23: 80 ug via INTRAVENOUS

## 2015-10-23 MED ORDER — FENTANYL CITRATE (PF) 250 MCG/5ML IJ SOLN
INTRAMUSCULAR | Status: AC
  Filled 2015-10-23: qty 5

## 2015-10-23 MED ORDER — HYDROMORPHONE HCL 1 MG/ML IJ SOLN
INTRAMUSCULAR | Status: AC
  Filled 2015-10-23: qty 1

## 2015-10-23 MED ORDER — MIDAZOLAM HCL 2 MG/2ML IJ SOLN
INTRAMUSCULAR | Status: AC
  Filled 2015-10-23: qty 2

## 2015-10-23 MED ORDER — HYDROMORPHONE HCL 1 MG/ML IJ SOLN: 1.0000 mg | INTRAMUSCULAR | Status: DC | PRN

## 2015-10-23 MED ORDER — CEFAZOLIN SODIUM-DEXTROSE 2-3 GM-% IV SOLR
2.0000 g | Freq: Three times a day (TID) | INTRAVENOUS | Status: AC
  Administered 2015-10-23: 2 g via INTRAVENOUS
  Filled 2015-10-23: qty 50

## 2015-10-23 MED ORDER — OXYCODONE-ACETAMINOPHEN 5-325 MG PO TABS
1.0000 | ORAL_TABLET | ORAL | Status: DC | PRN
  Administered 2015-10-23 – 2015-10-24 (×3): 2 via ORAL
  Filled 2015-10-23 (×3): qty 2

## 2015-10-23 MED ORDER — ENOXAPARIN SODIUM 40 MG/0.4ML ~~LOC~~ SOLN
40.0000 mg | SUBCUTANEOUS | Status: DC
  Administered 2015-10-24: 40 mg via SUBCUTANEOUS
  Filled 2015-10-23: qty 0.4

## 2015-10-23 MED ORDER — VECURONIUM BROMIDE 10 MG IV SOLR
INTRAVENOUS | Status: DC | PRN
  Administered 2015-10-23: 1 mg via INTRAVENOUS

## 2015-10-23 MED ORDER — PROPOFOL 10 MG/ML IV BOLUS
INTRAVENOUS | Status: DC | PRN
  Administered 2015-10-23: 70 mg via INTRAVENOUS
  Administered 2015-10-23: 130 mg via INTRAVENOUS

## 2015-10-23 MED ORDER — PROMETHAZINE HCL 25 MG PO TABS
12.5000 mg | ORAL_TABLET | ORAL | Status: DC | PRN
  Administered 2015-10-24: 12.5 mg via ORAL
  Filled 2015-10-23: qty 1

## 2015-10-23 MED ORDER — FENTANYL CITRATE (PF) 100 MCG/2ML IJ SOLN
INTRAMUSCULAR | Status: DC | PRN
  Administered 2015-10-23 (×4): 50 ug via INTRAVENOUS
  Administered 2015-10-23: 100 ug via INTRAVENOUS
  Administered 2015-10-23 (×3): 50 ug via INTRAVENOUS

## 2015-10-23 MED ORDER — MIDAZOLAM HCL 5 MG/5ML IJ SOLN
INTRAMUSCULAR | Status: DC | PRN
  Administered 2015-10-23: 2 mg via INTRAVENOUS

## 2015-10-23 MED ORDER — SUGAMMADEX SODIUM 200 MG/2ML IV SOLN
INTRAVENOUS | Status: DC | PRN
Start: 2015-10-23 — End: 2015-10-23
  Administered 2015-10-23: 150 mg via INTRAVENOUS
  Administered 2015-10-23: 50 mg via INTRAVENOUS

## 2015-10-23 MED ORDER — ONDANSETRON HCL 4 MG/2ML IJ SOLN
4.0000 mg | Freq: Four times a day (QID) | INTRAMUSCULAR | Status: DC | PRN
  Administered 2015-10-23 – 2015-10-24 (×3): 4 mg via INTRAVENOUS
  Filled 2015-10-23 (×4): qty 2

## 2015-10-23 MED ORDER — HYDROMORPHONE HCL 1 MG/ML IJ SOLN
0.2500 mg | INTRAMUSCULAR | Status: DC | PRN
  Administered 2015-10-23 (×4): 0.5 mg via INTRAVENOUS

## 2015-10-23 MED ORDER — ONDANSETRON 4 MG PO TBDP: 4.0000 mg | ORAL_TABLET | Freq: Four times a day (QID) | ORAL | Status: DC | PRN

## 2015-10-23 MED ORDER — ROCURONIUM BROMIDE 50 MG/5ML IV SOLN
INTRAVENOUS | Status: AC
  Filled 2015-10-23: qty 1

## 2015-10-23 MED ORDER — SUGAMMADEX SODIUM 200 MG/2ML IV SOLN
INTRAVENOUS | Status: AC
  Filled 2015-10-23: qty 2

## 2015-10-23 MED ORDER — ZOLPIDEM TARTRATE 5 MG PO TABS: 5.0000 mg | ORAL_TABLET | Freq: Every evening | ORAL | Status: DC | PRN

## 2015-10-23 MED ORDER — DEXAMETHASONE SODIUM PHOSPHATE 4 MG/ML IJ SOLN
INTRAMUSCULAR | Status: AC
  Filled 2015-10-23: qty 2

## 2015-10-23 MED ORDER — ONDANSETRON HCL 4 MG/2ML IJ SOLN
INTRAMUSCULAR | Status: AC
  Filled 2015-10-23: qty 2

## 2015-10-23 MED ORDER — ONDANSETRON HCL 4 MG/2ML IJ SOLN
4.0000 mg | Freq: Once | INTRAMUSCULAR | Status: DC | PRN
Start: 2015-10-23 — End: 2015-10-23

## 2015-10-23 MED ORDER — PROPOFOL 10 MG/ML IV BOLUS
INTRAVENOUS | Status: AC
  Filled 2015-10-23: qty 20

## 2015-10-23 MED ORDER — MEPERIDINE HCL 25 MG/ML IJ SOLN: 6.2500 mg | INTRAMUSCULAR | Status: DC | PRN

## 2015-10-23 MED ORDER — DEXAMETHASONE SODIUM PHOSPHATE 4 MG/ML IJ SOLN
INTRAMUSCULAR | Status: DC | PRN
  Administered 2015-10-23: 8 mg via INTRAVENOUS

## 2015-10-23 MED ORDER — HYDROCHLOROTHIAZIDE 25 MG PO TABS
25.0000 mg | ORAL_TABLET | Freq: Every day | ORAL | Status: DC
  Administered 2015-10-24: 25 mg via ORAL
  Filled 2015-10-23: qty 1

## 2015-10-23 MED ORDER — LIDOCAINE HCL (CARDIAC) 20 MG/ML IV SOLN
INTRAVENOUS | Status: DC | PRN
  Administered 2015-10-23: 80 mg via INTRATRACHEAL
  Administered 2015-10-23: 100 mg via INTRAVENOUS

## 2015-10-23 MED ORDER — ROCURONIUM BROMIDE 100 MG/10ML IV SOLN
INTRAVENOUS | Status: DC | PRN
  Administered 2015-10-23: 50 mg via INTRAVENOUS

## 2015-10-23 SURGICAL SUPPLY — 48 items
CANISTER SUCTION 2500CC (MISCELLANEOUS) IMPLANT
CHLORAPREP W/TINT 26ML (MISCELLANEOUS) ×4 IMPLANT
CLEANER TIP ELECTROSURG 2X2 (MISCELLANEOUS) ×4 IMPLANT
CLOSURE WOUND 1/2 X4 (GAUZE/BANDAGES/DRESSINGS)
COVER SURGICAL LIGHT HANDLE (MISCELLANEOUS) ×4 IMPLANT
DECANTER SPIKE VIAL GLASS SM (MISCELLANEOUS) ×4 IMPLANT
DRAPE LAPAROTOMY T 102X78X121 (DRAPES) ×4 IMPLANT
DRAPE UTILITY XL STRL (DRAPES) ×8 IMPLANT
DRSG OPSITE POSTOP 4X8 (GAUZE/BANDAGES/DRESSINGS) ×3 IMPLANT
ELECT CAUTERY BLADE 6.4 (BLADE) ×3 IMPLANT
ELECT REM PT RETURN 9FT ADLT (ELECTROSURGICAL) ×4
ELECTRODE REM PT RTRN 9FT ADLT (ELECTROSURGICAL) ×2 IMPLANT
GAUZE SPONGE 4X4 12PLY STRL (GAUZE/BANDAGES/DRESSINGS) IMPLANT
GAUZE SPONGE 4X4 16PLY XRAY LF (GAUZE/BANDAGES/DRESSINGS) IMPLANT
GLOVE BIOGEL PI IND STRL 8 (GLOVE) ×2 IMPLANT
GLOVE BIOGEL PI INDICATOR 8 (GLOVE) ×2
GLOVE ECLIPSE 7.5 STRL STRAW (GLOVE) ×4 IMPLANT
GOWN STRL REUS W/ TWL LRG LVL3 (GOWN DISPOSABLE) ×4 IMPLANT
GOWN STRL REUS W/TWL LRG LVL3 (GOWN DISPOSABLE) ×8
KIT BASIN OR (CUSTOM PROCEDURE TRAY) ×4 IMPLANT
KIT ROOM TURNOVER OR (KITS) ×4 IMPLANT
LIQUID BAND (GAUZE/BANDAGES/DRESSINGS) IMPLANT
NDL HYPO 25X1 1.5 SAFETY (NEEDLE) ×1 IMPLANT
NEEDLE HYPO 25X1 1.5 SAFETY (NEEDLE) ×4 IMPLANT
NS IRRIG 1000ML POUR BTL (IV SOLUTION) ×4 IMPLANT
PACK SURGICAL SETUP 50X90 (CUSTOM PROCEDURE TRAY) ×4 IMPLANT
PAD ARMBOARD 7.5X6 YLW CONV (MISCELLANEOUS) ×8 IMPLANT
PENCIL BUTTON HOLSTER BLD 10FT (ELECTRODE) ×4 IMPLANT
SPECIMEN JAR SMALL (MISCELLANEOUS) ×4 IMPLANT
SPONGE LAP 18X18 X RAY DECT (DISPOSABLE) ×4 IMPLANT
STAPLER VISISTAT 35W (STAPLE) ×3 IMPLANT
STRIP CLOSURE SKIN 1/2X4 (GAUZE/BANDAGES/DRESSINGS) IMPLANT
SUT MNCRL AB 4-0 PS2 18 (SUTURE) ×4 IMPLANT
SUT NOVA 1 T20/GS 25DT (SUTURE) ×15 IMPLANT
SUT SILK 2 0 SH (SUTURE) ×3 IMPLANT
SUT VIC AB 2-0 CT1 27 (SUTURE) ×8
SUT VIC AB 2-0 CT1 TAPERPNT 27 (SUTURE) ×2 IMPLANT
SUT VIC AB 2-0 SH 18 (SUTURE) ×3 IMPLANT
SUT VIC AB 3-0 SH 27 (SUTURE) ×12
SUT VIC AB 3-0 SH 27X BRD (SUTURE) ×4 IMPLANT
SUT VICRYL AB 2 0 TIES (SUTURE) ×3 IMPLANT
SYR BULB 3OZ (MISCELLANEOUS) ×4 IMPLANT
SYR CONTROL 10ML LL (SYRINGE) ×4 IMPLANT
TOWEL OR 17X24 6PK STRL BLUE (TOWEL DISPOSABLE) ×4 IMPLANT
TOWEL OR 17X26 10 PK STRL BLUE (TOWEL DISPOSABLE) ×4 IMPLANT
TUBE CONNECTING 12'X1/4 (SUCTIONS)
TUBE CONNECTING 12X1/4 (SUCTIONS) IMPLANT
YANKAUER SUCT BULB TIP NO VENT (SUCTIONS) IMPLANT

## 2015-10-23 NOTE — Anesthesia Postprocedure Evaluation (Signed)
Anesthesia Post Note  Patient: Stephanie SachsZolee S Calixte  Procedure(s) Performed: Procedure(s) (LRB): EXCISION ABDOMINAL WALL MASS (N/A)  Patient location during evaluation: PACU Anesthesia Type: General Level of consciousness: awake Pain management: pain level controlled Vital Signs Assessment: post-procedure vital signs reviewed and stable Respiratory status: spontaneous breathing Cardiovascular status: stable Anesthetic complications: no    Last Vitals:  Filed Vitals:   10/23/15 1425 10/23/15 1510  BP: 135/84 144/74  Pulse: 65 69  Temp: 37.1 C 37.1 C  Resp: 18 18    Last Pain:  Filed Vitals:   10/23/15 1527  PainSc: Asleep                 EDWARDS,Mirranda Monrroy

## 2015-10-23 NOTE — Transfer of Care (Signed)
Immediate Anesthesia Transfer of Care Note  Patient: Stephanie Hays  Procedure(s) Performed: Procedure(s): EXCISION ABDOMINAL WALL MASS (N/A)  Patient Location: PACU  Anesthesia Type:General  Level of Consciousness: awake, oriented and patient cooperative  Airway & Oxygen Therapy: Patient Spontanous Breathing and Patient connected to nasal cannula oxygen  Post-op Assessment: Report given to RN and Post -op Vital signs reviewed and stable  Post vital signs: Reviewed  Last Vitals:  Filed Vitals:   10/23/15 0952  BP: 174/96  Pulse: 75  Temp: 36.8 C  Resp: 18    Complications: No apparent anesthesia complications

## 2015-10-23 NOTE — H&P (Signed)
Stephanie Hays is an 34 y.o. female.   Chief Complaint: Lower abdominal wall mass HPI:  Previous C-section.  Symptomatic right lower abdominal wall mass noted since  Past Medical History  Diagnosis Date  . Endometriosis   . PONV (postoperative nausea and vomiting)   . Hypertension     takes HCTZ and Valsartan daily  . Headache   . Nocturia     Past Surgical History  Procedure Laterality Date  . Cesarean section  2001/2006/2010/2013  . Cesarean section  05/02/2012    Procedure: CESAREAN SECTION;  Surgeon: Serita Kyle, Hays;  Location: [facility];  Service: Gynecology;  Laterality: N/A;  Repeat cesarean section with delivery of baby boy at 7. Apgars 9/10.  . Laparoscopy  10 yrs ago    History reviewed. No pertinent family history. Social History:  reports that she has never smoked. She has never used smokeless tobacco. She reports that she does not drink alcohol or use illicit drugs.  Allergies: No Known Allergies  Medications Prior to Admission  Medication Sig Dispense Refill  . hydrochlorothiazide (HYDRODIURIL) 25 MG tablet Take 25 mg by mouth daily.    . valsartan (DIOVAN) 320 MG tablet Take 320 mg by mouth daily.    Marland Kitchen ibuprofen (ADVIL,MOTRIN) 600 MG tablet Take 600 mg by mouth every 6 (six) hours as needed. For pain      No results found for this or any previous visit (from the past 48 hour(s)). No results found.  Review of Systems  Constitutional: Negative.   HENT: Negative.   Gastrointestinal: Positive for abdominal pain.  Musculoskeletal: Negative.   Skin: Negative.   Neurological: Negative.   Endo/Heme/Allergies: Negative.     Blood pressure 174/96, pulse 75, temperature 98.3 F (36.8 C), temperature source Oral, resp. rate 18, height  (1.626 m), weight 121.473 kg (267 lb 12.8 oz), last menstrual period 09/25/2015, SpO2 100 %. Physical Exam  Constitutional: Vital signs are normal. She appears well-developed.  Obese  HENT:  Head: Normocephalic and  atraumatic.  Eyes: EOM are normal. Pupils are equal, round, and reactive to light.  Cardiovascular: Normal rate, regular rhythm and normal heart sounds.   Respiratory: Effort normal and breath sounds normal.  GI: Soft. Normal appearance and bowel sounds are normal. There is tenderness in the right lower quadrant.    Skin: Skin is warm and dry.  Psychiatric: She has a normal mood and affect. Her behavior is normal. Judgment and thought content normal.     Assessment/Plan  Stephanie Hays 08/22/2015 9:03 AM Location: [facility] Patient #: [id number] DOB: August 31, 1981 Married / Language: English / Race: Black or African American Female   History of Present Illness Stephanie Hays; 08/22/2015 9:27 AM) Patient words: abdominal mass.  Note:Patient here with a recurrent endometrial implant in the lowere abdominal Pfannenstiel incision from eleven years ago.  Allergies Stephanie Hays; 08/22/2015 9:03 AM) No Known Drug Allergies10/18/2016  Medication History (Stephanie Hays, Hays; 08/22/2015 9:04 AM) HydroCHLOROthiazide (  Tablet, Oral) Active. Valsartan (  Tablet, Oral) Active. Medications Reconciled  Vitals (Stephanie Hays Hays; 08/22/2015 9:03 AM) 08/22/2015 9:03 AM Weight: 256 lb Height: 64in Body Surface Area: 2.17 m Body Mass Index: 43.94 kg/m  Temp.: 98.78F(Oral)  Pulse: 78 (Regular)  BP: 132/84 (Sitting, Left Arm, Standard)  Physical Exam (Stephanie Hays; 08/22/2015 9:19 AM) General Mental Status-Alert. General Appearance-Well groomed. Orientation-Oriented X4. Build & Nutrition-Well nourished.  Obese  Chest and Lung Exam: Chest and lung exam reveals -normal  excursion with symmetric chest walls, normal resonance, no flatness or dullness, non-tender and normal tactile fremitus and on auscultation, normal breath sounds, no adventitious sounds and normal vocal resonance.  Cardiovascular examination reveals -normal heart  sounds, regular rate and rhythm with no murmurs.  Abdomen:  Inspection of the abdomen reveals - No Visible peristalsis and No Hernias. Note: Old Pfannenstiel scar in the crease of the pannus with palpable mass in the right lower part.   IMPLANTED ENDOMETRIOSIS (N80.8)  Story: Previously excised endometrial implant in the abdominal wall about 11 years ago, started noticing it again about one year ago and has gotten increasingly large. Much larger than the original mass excised.  Impression: Recurrent endometrial implant. Because of the recurrence I am concerned that there may be subfascial extension. Will get a CT scan of the abdomen to assess local pathology. Will follow up with the patient shortly after that. Current Plans CT ABDOMEN AND PELVIS W CONTRAST--confirms the presence of mass, not into the peritoneal cavity, into the muscle.  Recurrent. Pt Education - Endometriosis: discussed with patient and provided information. Patient was instructed to follow-up in one week.  Stephanie Hays 10/23/2015, 10:20 AM

## 2015-10-23 NOTE — Op Note (Signed)
OPERATIVE REPORT  DATE OF OPERATION: 10/23/2015  PATIENT:  Stephanie Hays  34 y.o. 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 After an adequate general endotracheal anesthetic was administered, she was prepped and draped in the usual sterile manner exposing her entire abdomen.  A proper timeout was performed identifying the patient and the procedure to be performed. The patient had a significant amount of abdominal pannus, and retraction of this is necessary for most of the case because the mass was in the right lower part of her previous Pfannenstiel incision.  An incision was made on the right side of the Pfannenstiel incision using a #10 blade and taken down into the subcutaneous tissue. The tumor was fairly superficial and we dissected around this circumferentially using primarily electrocautery to obtain hemostasis and also dissect away the tissue. Circumferentially we dissected down to the fascia around this mass and initially on the medial aspect dissected into the fascia into the muscle but not cutting the muscle itself taking the fascia with the mass. In doing so in a circumferential manner with left a defect that measured approximately 12 x 6 cm in size.  The with of this fascial defect was 12 cm. Mesh was not included in repair after hemostasis was obtained. Circumferential flaps were made by dissecting away the  subcutaneous tissue from the subcutaneous tissue superficially and from the muscle underneath. We were able to bring the fascia back together using interrupted simple stitches of #1 Novafil and also evenly space intermittent stitches of horizontal mattress sutures of the same suture material. This repair appeared to hold. Muscle was Intact. He was saline then closed in 3 layers. The deep subcutaneous layer was reapproximated using a running 2-0 Vicryl suture. The more superficial subcutaneous tissue was reapproximated using running 3-0 Vicryl. The skin was closed using stainless steel staples. No drains were left in the wound.  All counts were correct including needles, sponges, and instruments. A sterile dressing was applied. An abdominal binder was applied.  PATIENT DISPOSITION:  PACU - hemodynamically stable.   Detron Carras 12/19/20161:04 PM

## 2015-10-23 NOTE — Progress Notes (Signed)
Received from PACU to 6n30. Alert but very sleepy, denies nausea/pain at this time, oriented to room and surroundings, family at bedside

## 2015-10-23 NOTE — Anesthesia Procedure Notes (Signed)
Procedure Name: Intubation Date/Time: 10/23/2015 10:50 AM Performed by: Jenne Campus Pre-anesthesia Checklist: Patient identified, Emergency Drugs available, Suction available, Patient being monitored and Timeout performed Patient Re-evaluated:Patient Re-evaluated prior to inductionOxygen Delivery Method: Circle system utilized Preoxygenation: Pre-oxygenation with 100% oxygen Intubation Type: IV induction Ventilation: Mask ventilation without difficulty Laryngoscope Size: Miller and 2 Grade View: Grade I Tube type: Oral Tube size: 7.0 mm Number of attempts: 1 Airway Equipment and Method: Stylet and LTA kit utilized Placement Confirmation: ETT inserted through vocal cords under direct vision,  positive ETCO2,  CO2 detector and breath sounds checked- equal and bilateral Secured at: 22 cm Tube secured with: Tape Dental Injury: Teeth and Oropharynx as per pre-operative assessment

## 2015-10-23 NOTE — Anesthesia Preprocedure Evaluation (Addendum)
Anesthesia Evaluation  Patient identified by MRN, date of birth, ID band Patient awake    Reviewed: Allergy & Precautions, NPO status , Patient's Chart, lab work & pertinent test results  Airway Mallampati: I  TM Distance: >3 FB Neck ROM: Full    Dental  (+) Teeth Intact, Dental Advisory Given   Pulmonary neg pulmonary ROS,    Pulmonary exam normal breath sounds clear to auscultation       Cardiovascular hypertension, Pt. on medications Normal cardiovascular exam Rhythm:Regular Rate:Normal     Neuro/Psych    GI/Hepatic negative GI ROS, Neg liver ROS,   Endo/Other  Morbid obesity  Renal/GU negative Renal ROS     Musculoskeletal   Abdominal   Peds  Hematology   Anesthesia Other Findings   Reproductive/Obstetrics negative OB ROS                           Anesthesia Physical Anesthesia Plan  ASA: II  Anesthesia Plan: General   Post-op Pain Management:    Induction: Intravenous  Airway Management Planned:   Additional Equipment:   Intra-op Plan:   Post-operative Plan: Extubation in OR  Informed Consent: I have reviewed the patients History and Physical, chart, labs and discussed the procedure including the risks, benefits and alternatives for the proposed anesthesia with the patient or authorized representative who has indicated his/her understanding and acceptance.     Plan Discussed with: CRNA and Surgeon  Anesthesia Plan Comments:         Anesthesia Quick Evaluation

## 2015-10-24 ENCOUNTER — Encounter (HOSPITAL_COMMUNITY): Payer: Self-pay | Admitting: General Surgery

## 2015-10-24 DIAGNOSIS — N806 Endometriosis in cutaneous scar: Secondary | ICD-10-CM | POA: Diagnosis not present

## 2015-10-24 MED ORDER — PROMETHAZINE HCL 12.5 MG PO TABS: 12.5000 mg | ORAL_TABLET | ORAL | Status: DC | PRN

## 2015-10-24 MED ORDER — OXYCODONE-ACETAMINOPHEN 5-325 MG PO TABS: 1.0000 | ORAL_TABLET | ORAL | Status: DC | PRN

## 2015-10-24 NOTE — Discharge Instructions (Signed)
Pelvic Mass A pelvic mass is an abnormal growth in the pelvis. In your case the growth was in the subcutaneous tissue in the pelvic area. CAUSES Many things can cause a pelvic mass, including:  Endometrial implant into the abdominal wall, previously excised. SIGNS AND SYMPTOMS Symptoms of a pelvic mass may include:  Cramping.  Cyclic pain  Weakness.  Pain in the pelvis, side, or back.  Problems with vaginal bleeding, including:  Light or heavy bleeding with or without blood clots.  Irregular menstruation.  Pain with menstruation. Some pelvic masses do not cause symptoms. DIAGNOSIS To make a diagnosis, your health care provider will need to learn more about the mass. You may have tests or procedures done, such as:  CT scan.  MRI.  A surgery to look inside of your abdomen   A biopsy that is performed with a needle or during laparoscopy or surgery. In some cases, what seemed like a pelvic mass may actually be something else, such as a mass in one of the organs that are near the pelvis, an infection (abscess) or scar tissue (adhesions) that formed after a surgery. TREATMENT Your surgery removed the growth from your abdominal wall. Treatment will depend on the cause of the mass. HOME CARE INSTRUCTIONS What you need to do at home will depend on the cause of the mass. Follow the instructions that your health care provider gives to you. In general:  Keep all follow-up visits as directed by your health care provider. This is important.  Take medicines only as directed by your health care provider.  Follow any restrictions that are given to you by your health care provider.  Keep your incision as dry as possible. SEEK MEDICAL CARE IF:  You develop new symptoms. SEEK IMMEDIATE MEDICAL CARE IF:  You vomit bright red blood or vomit material that looks like coffee grounds.  You have blood in your stools, or the stools turn black and tarry.  You have an abnormal or  increased amount of vaginal bleeding.  You have a fever.  You develop easy bruising or bleeding.  You develop sudden or worsening pain that is not controlled by your medicine.  You feel worsening weakness, or you have a fainting episode.  You feel that the mass has suddenly gotten larger.  You develop severe bloating in your abdomen or your pelvis.  You cannot pass any urine.  You are unable to have a bowel movement.   Call [facility] at [phone] if you have problems.  Marta LamasJames O. Gae BonWyatt, III, MD, FACS [phone]--pager [phone]--office [facility]

## 2015-10-24 NOTE — Discharge Summary (Signed)
Physician Discharge Summary  Patient ID: Stephanie Hays MRN: [id number] DOB/AGE: 06-18-1981 34 y.o.  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  She was admitted because the excision was much more extensive than originally expected and wanted to watch the patient for wound problems and pain control.  Consults: None  Significant Diagnostic Studies: None  Treatments: IV hydration, analgesia: Dilaudid and percocet and antinausea  Discharge Exam: Blood pressure 162/94, pulse 70, temperature 100.2 F (37.9 C), temperature source Oral, resp. rate 18, height  (1.626 m), weight 121.473 kg (267 lb 12.8 oz), last menstrual period 09/25/2015, SpO2 100 %. General appearance: alert, cooperative and no distress Resp: clear to auscultation bilaterally GI: soft, non-tender; bowel sounds normal; no masses,  no organomegaly Incision/Wound: Draining a bit of serous fluid in the subcutaenous area.  Incision at the depth of the pannus inferiorly.  Disposition: 01-Home or Self Care      Discharge Instructions    Call MD for:  difficulty breathing, headache or visual disturbances    Complete by:  As directed      Call MD for:  extreme fatigue    Complete by:  As directed      Call MD for:  hives    Complete by:  As directed      Call MD for:  persistant dizziness or light-headedness    Complete by:  As directed      Call MD for:  persistant nausea and vomiting    Complete by:  As directed      Call MD for:  redness, tenderness, or signs of infection (pain, swelling, redness, odor or green/yellow discharge around incision site)    Complete by:  As  directed      Call MD for:  severe uncontrolled pain    Complete by:  As directed      Call MD for:  temperature >100.4    Complete by:  As directed      Change dressing (specify)    Complete by:  As directed   Dressing change: one time per day using dry 4x4 gauze and ABD pad..     Diet - low sodium heart healthy    Complete by:  As directed      Discharge instructions    Complete by:  As directed   Your wound will tend to stay moist because of skin folds.  Whenever possible try to keep the skin fold open to air, with or without the dressing intact.  Change the dry gauze on the wound daily.     Driving Restrictions    Complete by:  As directed   One week     Increase activity slowly    Complete by:  As directed      Lifting restrictions    Complete by:  As directed   No greater than 20 pounds for 6 weeks.     Sexual Activity Restrictions    Complete by:  As directed   3 weeks            Medication List    STOP taking these medications        ibuprofen 600 MG tablet  Commonly known as:  ADVIL,MOTRIN  TAKE these medications        hydrochlorothiazide 25 MG tablet  Commonly known as:  HYDRODIURIL  Take 25 mg by mouth daily.     oxyCODONE-acetaminophen 5-325 MG tablet  Commonly known as:  PERCOCET/ROXICET  Take 1-2 tablets by mouth every 4 (four) hours as needed for moderate pain.     promethazine 12.5 MG tablet  Commonly known as:  PHENERGAN  Take 1 tablet (12.5 mg total) by mouth every 4 (four) hours as needed for nausea, vomiting or refractory nausea / vomiting.     valsartan 320 MG tablet  Commonly known as:  DIOVAN  Take 320 mg by mouth daily.       Follow-up Information    Follow up with Jimmye NormanJAMES Scottlyn Mchaney, MD. Schedule an appointment as soon as possible for a visit in 10 days.   Specialty:  General Surgery   Why:  For wound re-check   Contact information:   [address] [place] [address] [phone]       Signed: Jimmye NormanJAMES  Cire Deyarmin 10/24/2015, 3:15 PM

## 2015-10-24 NOTE — Progress Notes (Signed)
Discharge instructions given, and patient verbalizes understanding. Discharged home with prescriptions and dressing materials, accompanied by a family member.

## 2015-12-05 IMAGING — CT CT ABD-PELV W/ CM
2 of 4 series · 15 of 46 positions shown, 17 images · IV contrast (APPLIED)
Comparison: None.

CLINICAL DATA: History of endometriosis, pain with menses

EXAM:
CT ABDOMEN AND PELVIS WITH CONTRAST
TECHNIQUE: Multidetector CT imaging of the abdomen and pelvis was performed
using the standard protocol following bolus administration of
intravenous contrast.
CONTRAST:  125mL 01JBVH-NKK IOPAMIDOL (01JBVH-NKK) INJECTION 61%

[Series 2: abd/pelvis w/cm · axial · 0.95mm/px · z∈[-487,-52]mm · 12 of 99 slices shown, 14 images]
[im 8/99  soft-tissue]
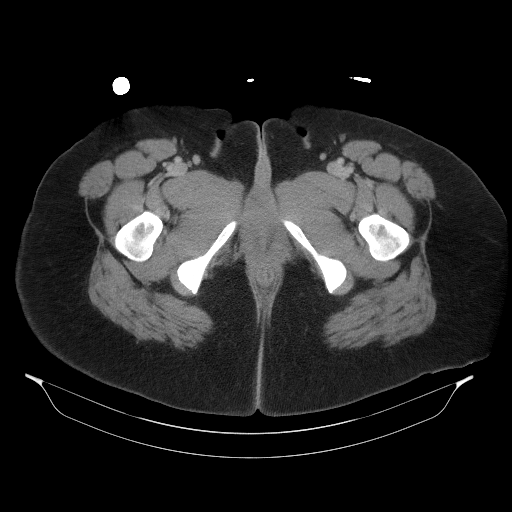
[im 8/99  bone]
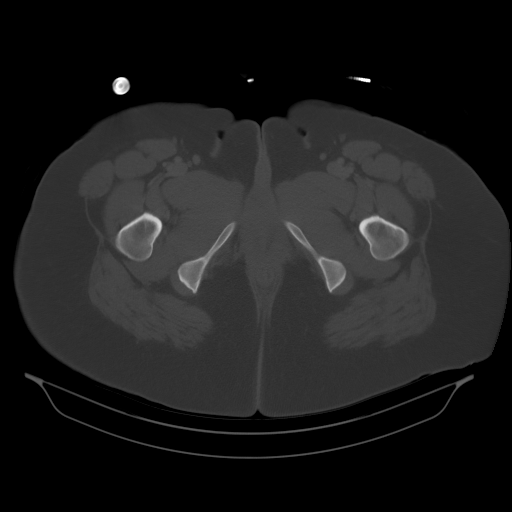
[im 16/99  soft-tissue]
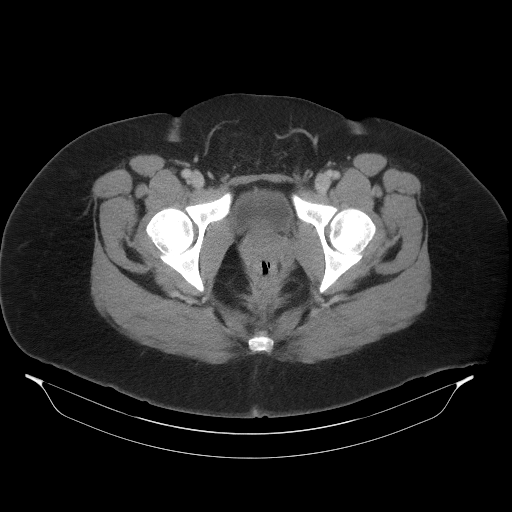
[im 24/99  soft-tissue]
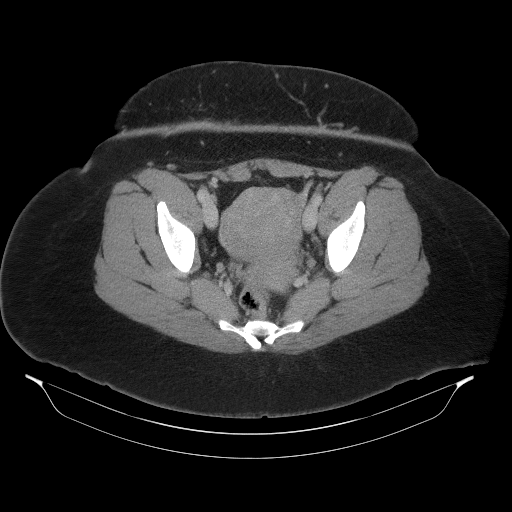
[im 32/99  soft-tissue]
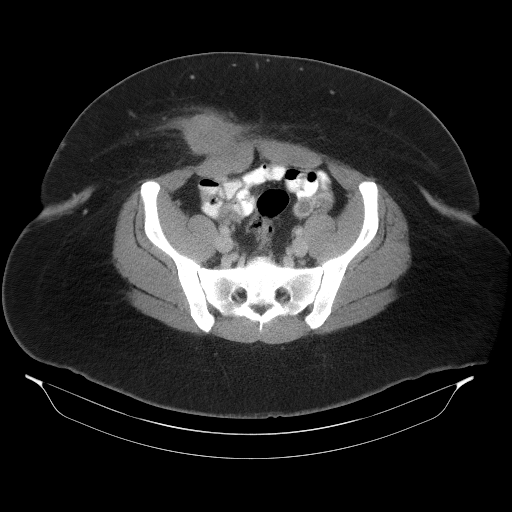
[im 40/99  soft-tissue]
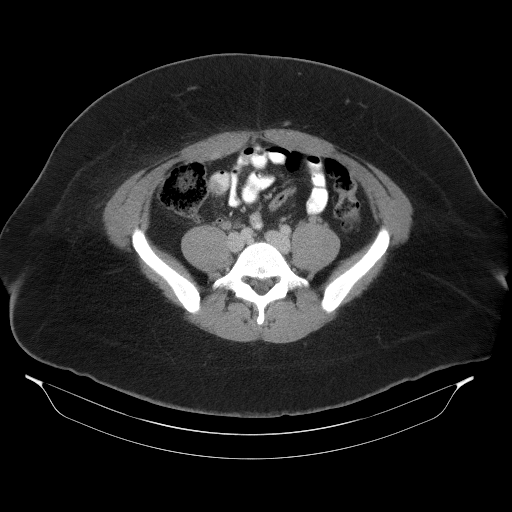
[im 48/99  soft-tissue]
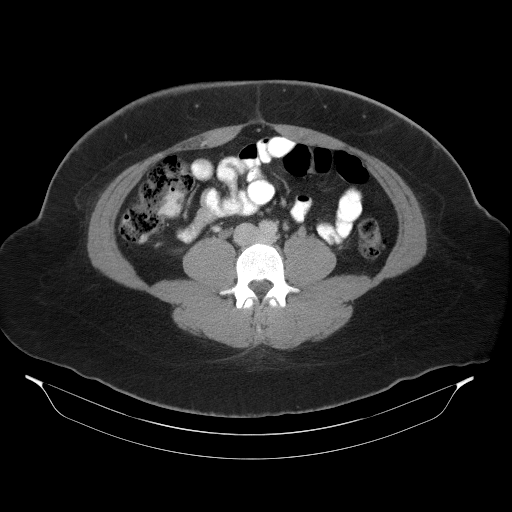
[im 55/99  soft-tissue]
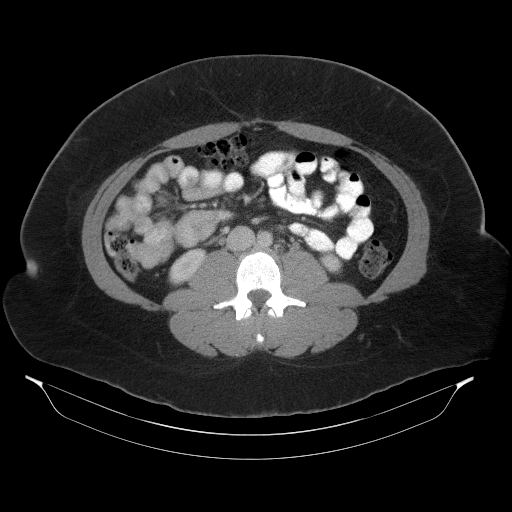
[im 63/99  soft-tissue]
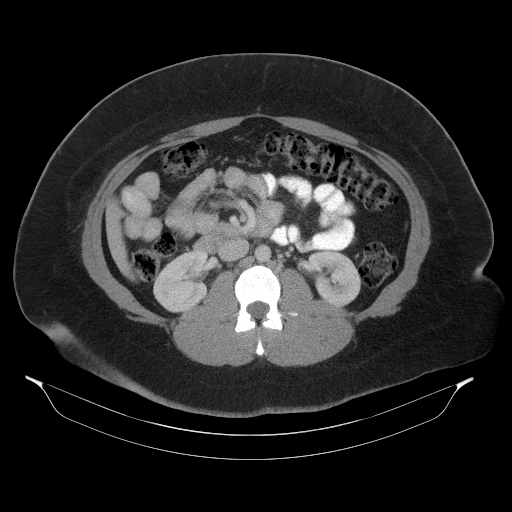
[im 71/99  soft-tissue]
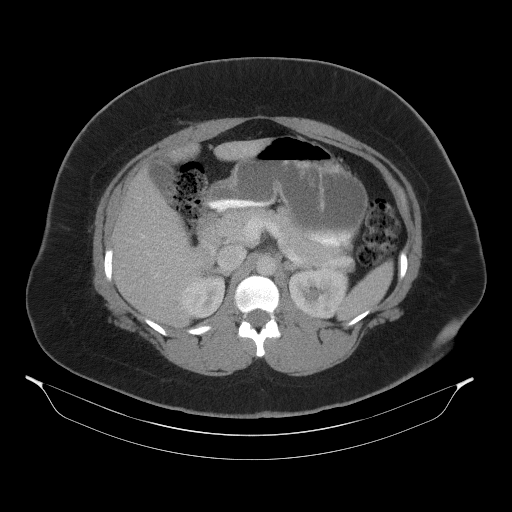
[im 71/99  bone]
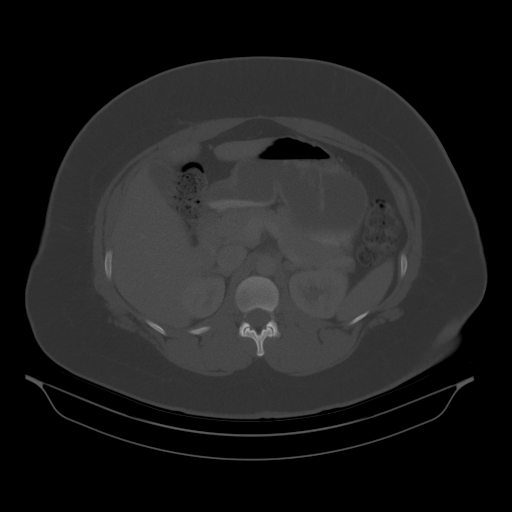
[im 79/99  soft-tissue]
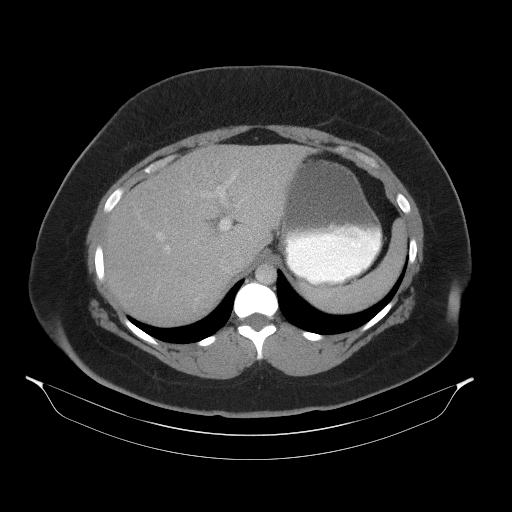
[im 87/99  soft-tissue]
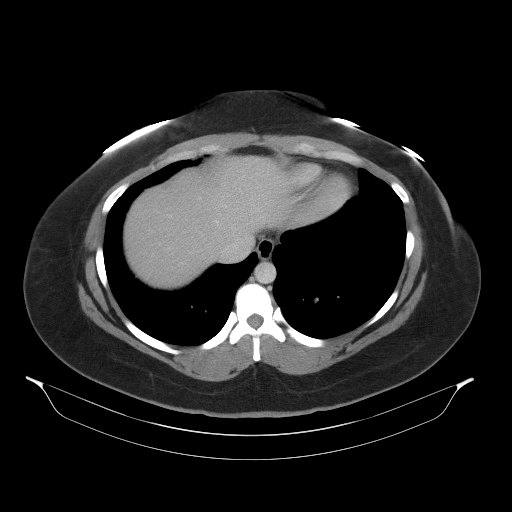
[im 95/99  soft-tissue]
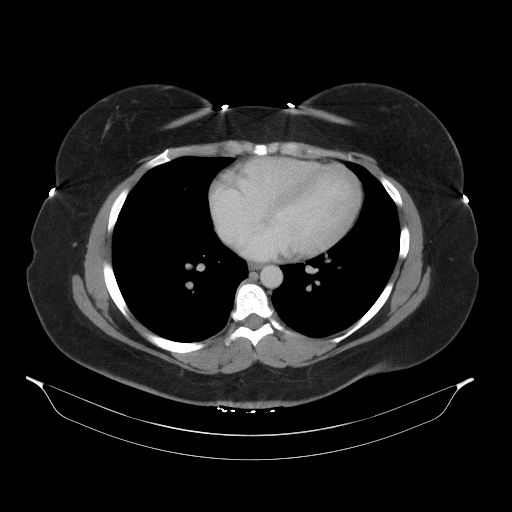

[Series 3: cor · coronal · 0.98mm/px · 3 of 112 slices shown]
[im 38/112  soft-tissue]
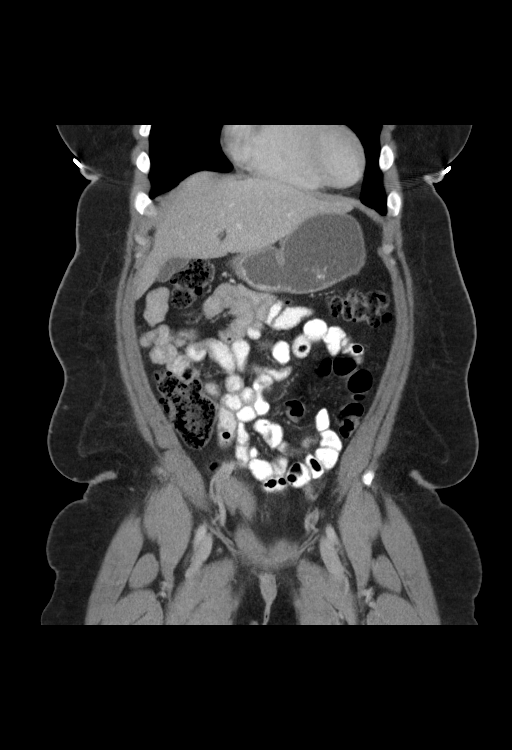
[im 50/112  soft-tissue]
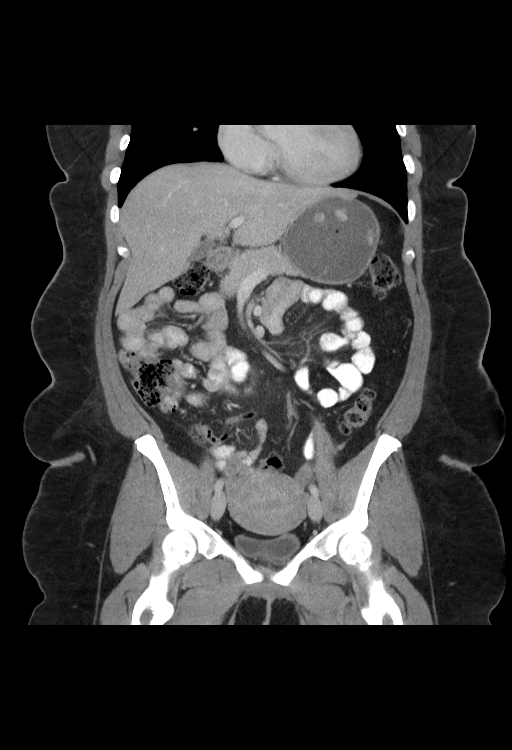
[im 62/112  soft-tissue]
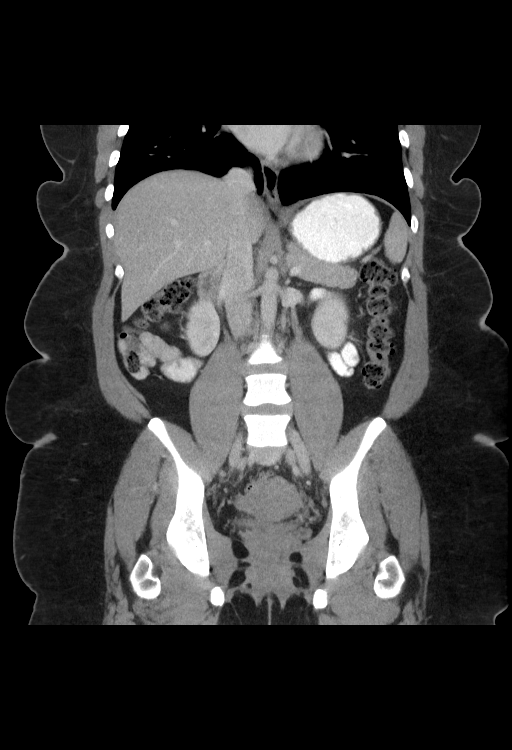

[15 of 46 positions shown; findings below may reference images not displayed]

FINDINGS: The lung bases are clear. The liver enhances with no focal
abnormality and no ductal dilatation is seen. No calcified
gallstones are seen. The pancreas is unremarkable and the pancreatic
duct is not dilated. The adrenal glands and spleen are unremarkable.
The stomach is moderately fluid distended with contrast. No
abnormality is seen. The kidneys enhance with no calculus or mass
and there is no evidence of hydronephrosis. The abdominal aorta is
normal in caliber. No abnormal soft tissue implants are seen within
the peritoneal cavity. No adenopathy is noted.

The uterus is within normal limits in size in the endometrium by CT
is unremarkable. There is a probable small collapsing cyst in the
left adnexa of 2.0 cm with a tiny amount of adjacent fluid. The
urinary bladder is decompressed and cannot be evaluated. The colon
is largely decompressed. The terminal ileum is unremarkable, and the
appendix fills with air normally.

However, there is abnormal soft tissue within the subcutaneous fat
of the panniculus to the right of midline within the pelvis. This
soft tissue mass measures 4.8 x 3.8 x 4.8 cm with an attenuation of
37 HU. This soft tissue mass does abut the musculature of the
anterior abdominal wall. Biopsy of this lesion could be performed if
warranted clinically. Changes of stress sclerosis are noted
involving the SI joints. The lumbar vertebrae are in normal
alignment with normal intervertebral disc spaces.
IMPRESSION: 1. Rounded soft tissue mass in the superficial soft tissues of the
right lower pelvis, abutting the anterior abdominal wall musculature
of 4.8 x 3.8 x 4.8 cm. If necessary, percutaneous biopsy of this
lesion could be performed.
2. No intra peritoneal soft tissue implants are evident.
3. Probable small collapsing left ovarian cyst with a tiny amount of
adjacent free fluid.

## 2021-01-22 ENCOUNTER — Ambulatory Visit (INDEPENDENT_AMBULATORY_CARE_PROVIDER_SITE_OTHER): Payer: 59 | Admitting: Cardiovascular Disease

## 2021-01-22 ENCOUNTER — Encounter: Payer: Self-pay | Admitting: Cardiovascular Disease

## 2021-01-22 ENCOUNTER — Other Ambulatory Visit: Payer: Self-pay

## 2021-01-22 VITALS — BP 160/106 | HR 55 | Ht 64.0 in | Wt 184.2 lb

## 2021-01-22 DIAGNOSIS — E876 Hypokalemia: Secondary | ICD-10-CM | POA: Diagnosis not present

## 2021-01-22 DIAGNOSIS — Z5181 Encounter for therapeutic drug level monitoring: Secondary | ICD-10-CM | POA: Diagnosis not present

## 2021-01-22 DIAGNOSIS — I10 Essential (primary) hypertension: Secondary | ICD-10-CM

## 2021-01-22 HISTORY — DX: Essential (primary) hypertension: I10

## 2021-01-22 HISTORY — DX: Hypokalemia: E87.6

## 2021-01-22 MED ORDER — SPIRONOLACTONE 25 MG PO TABS: 25.0000 mg | ORAL_TABLET | Freq: Every day | ORAL | 1 refills | Status: DC

## 2021-01-22 NOTE — Progress Notes (Signed)
Advanced Hypertension Clinic Initial Assessment:    Date:  01/22/2021   ID:  Stephanie Hays, DOB [birth year], MRN [id number]  PCP:  Geraldine Contras, NP  Cardiologist:  No primary care provider on file.  Nephrologist:  Referring MD: Maxie Better, MD   CC: Hypertension  History of Present Illness:    Stephanie Hays is a 40 y.o. female with a hx of hypertension and endometriosis here to establish care in the hypertension clinic. She was first diagnosed with hypertension during pregnancy.  She developed pre-eclampsia and her BP never returned to baseline.  She saw to cardiologist and nephrologist.  She had an abdominal CT-A 10/2017 that revealed no adenomas and patent renal arteries.  She also had renal artery Dopplers that were negative around the same time.  Thyroid function was normal 03/2020.  She had a sleep study in 2018 that was positive.  She was on a CPAP for years but lost weight and had a repeat study that was negative for OSA.  When pregnant she was on labetalol.  She subsequently on losartan/valsartan which were discontinued due to recalls and tapered once she lost weight.  She did not tolerate amlodipine and her blood pressure was high when she was switched from HCTZ to spironolactone.  She exercises regularly 5 days a week doing cardio and strength for 30 minutes to an hour.  She feels good with exercise and has no exertional chest pain or shortness of breath.  She denies lower extremity edema, orthopnea, or PND.  She cooks at home and limits her sodium intake.  She also follows a low-carb diet and most recently has been trying to limit she is because she knows this has a lot of salt.  She does not drink caffeine or alcohol.  She saw Dr. Cherly Hensen on 12/2020 and her BP was 145/101.  She was referred to advanced hypertension clinic for further evaluation and management.  She reports a longstanding history of bradycardia.  She denies lightheadedness or dizziness.  She notes that her  heart rate goes down especially when she is sleeping.  Previous antihypertensives: Valsartan- recall Losartan- recall, not as effective Labetalol Amlodipine-headaches   Past Medical History:  Diagnosis Date  . Endometriosis   . Essential hypertension 01/22/2021  . Headache   . Hypertension    takes HCTZ and Valsartan daily  . Hypokalemia 01/22/2021  . Nocturia   . PONV (postoperative nausea and vomiting)    "only happened because they had to give me extra anesthesia wmy last c-section; I had pre-eclampsia"    Past Surgical History:  Procedure Laterality Date  . ABDOMINAL WALL DEFECT REPAIR N/A 10/23/2015   Procedure: EXCISION ABDOMINAL WALL MASS;  Surgeon: Jimmye Norman, MD;  Location: [facility] OR;  Service: General;  Laterality: N/A;  . CESAREAN SECTION  2001; 2006; 2010; 2013  . CESAREAN SECTION  05/02/2012   Procedure: CESAREAN SECTION;  Surgeon: Serita Kyle, MD;  Location: [facility] ORS;  Service: Gynecology;  Laterality: N/A;  Repeat cesarean section with delivery of baby boy at 25. Apgars 9/10.  Marland Kitchen EXCISION MASS ABDOMINAL  10/23/2015  . MASS EXCISION  06/04/2004   of subcutaneous tissue of the abdominal wall/notes 03/19/2011    Current Medications: Current Meds  Medication Sig  . hydrochlorothiazide (HYDRODIURIL) 50 MG tablet Take 50 mg by mouth daily.  Marland Kitchen spironolactone (ALDACTONE) 25 MG tablet Take 1 tablet (25 mg total) by mouth daily.  . [DISCONTINUED] potassium chloride SA (KLOR-CON) 20 MEQ tablet  Take 1 tablet by mouth 3 (three) times daily.     Allergies:   Patient has no known allergies.   Social History   Socioeconomic History  . Marital status: Married    Spouse name: Not on file  . Number of children: Not on file  . Years of education: Not on file  . Highest education level: Not on file  Occupational History  . Not on file  Tobacco Use  . Smoking status: Never Smoker  . Smokeless tobacco: Never Used  Substance and Sexual Activity  . Alcohol use: No  .  Drug use: No  . Sexual activity: Yes    Birth control/protection: None  Other Topics Concern  . Not on file  Social History Narrative  . Not on file   Social Determinants of Health   Financial Resource Strain: Low Risk   . Difficulty of Paying Living Expenses: Not hard at all  Food Insecurity: No Food Insecurity  . Worried About Programme researcher, broadcasting/film/video in the Last Year: Never true  . Ran Out of Food in the Last Year: Never true  Transportation Needs: No Transportation Needs  . Lack of Transportation (Medical): No  . Lack of Transportation (Non-Medical): No  Physical Activity: Sufficiently Active  . Days of Exercise per Week: 5 days  . Minutes of Exercise per Session: 60 min  Stress: No Stress Concern Present  . Feeling of Stress : Not at all  Social Connections: Not on file     Family History: The patient's family history includes Heart attack in her maternal grandmother; Hypertension in her brother, maternal aunt, maternal uncle, and mother; Stroke in her maternal uncle.  ROS:   Please see the history of present illness.    All other systems reviewed and are negative.  EKGs/Labs/Other Studies Reviewed:    EKG:  EKG is ordered today.  The ekg ordered today demonstrates sinus bradycardia.  Rate 55 bpm.  Recent Labs: No results found for requested labs within last 8760 hours.   Recent Lipid Panel No results found for: CHOL, TRIG, HDL, CHOLHDL, VLDL, LDLCALC, LDLDIRECT  Physical Exam:   VS:  BP (!) 160/106 (BP Location: Right Arm, Patient Position: Sitting, Cuff Size: Normal)   Pulse (!) 55   Ht 5\' 4"  (1.626 m)   Wt 184 lb 3.2 oz (83.6 kg)   BMI 31.62 kg/m  , BMI Body mass index is 31.62 kg/m. GENERAL:  Well appearing HEENT: Pupils equal round and reactive, fundi not visualized, oral mucosa unremarkable NECK:  No jugular venous distention, waveform within normal limits, carotid upstroke brisk and symmetric, no bruits LUNGS:  Clear to auscultation bilaterally HEART:   RRR.  PMI not displaced or sustained,S1 and S2 within normal limits, no S3, no S4, no clicks, no rubs, no murmurs ABD:  Flat, positive bowel sounds normal in frequency in pitch, no bruits, no rebound, no guarding, no midline pulsatile mass, no hepatomegaly, no splenomegaly EXT:  2 plus pulses throughout, no edema, no cyanosis no clubbing SKIN:  No rashes no nodules NEURO:  Cranial nerves II through XII grossly intact, motor grossly intact throughout PSYCH:  Cognitively intact, oriented to person place and time   ASSESSMENT:    1. Essential hypertension   2. Therapeutic drug monitoring   3. Hypokalemia     PLAN:    #Essential hypertension: Stephanie Hays had a thorough work-up for secondary causes of hypertension.  Unfortunately it seems that she has essential hypertension that is getting more  difficult to control.  She denies any recent life stressors.  She has great exercise habits and is eating healthy.  She is already limiting her sodium intake.  We did discuss trying to increase the potassium in her diet.  We will stop her potassium supplement and add spironolactone 25 mg daily.  Check a basic metabolic panel in a week.  Time spent: 35 minutes-Greater than 50% of this time was spent in counseling, explanation of diagnosis, planning of further management, and coordination of care.   Disposition:    FU with MD/PharmD in 1 month    Medication Adjustments/Labs and Tests Ordered: Current medicines are reviewed at length with the patient today.  Concerns regarding medicines are outlined above.  Orders Placed This Encounter  Procedures  . Basic metabolic panel  . EKG 12-Lead   Meds ordered this encounter  Medications  . spironolactone (ALDACTONE) 25 MG tablet    Sig: Take 1 tablet (25 mg total) by mouth daily.    Dispense:  90 tablet    Refill:  1     Signed, Chilton Si, MD  01/22/2021 5:03 PM    [facility]

## 2021-01-22 NOTE — Patient Instructions (Signed)
Medication Instructions:  START SPIRONOLACTONE 25 MG DAILY   STOP POTASSIUM    Labwork: BMET IN 1 WEEK    Testing/Procedures: NONE    Follow-Up: 02/23/2021 at 2:00 pm with Pharm D    Special Instructions:   MONITOR YOUR BLOOD PRESSURE TWICE A DAY, LOG IN THE BOOK PROVIDED. BRING THE BOOK AND YOUR BLOOD PRESSURE MACHINE TO YOUR FOLLOW UP IN 1 MONTH   DASH Eating Plan DASH stands for "Dietary Approaches to Stop Hypertension." The DASH eating plan is a healthy eating plan that has been shown to reduce high blood pressure (hypertension). It may also reduce your risk for type 2 diabetes, heart disease, and stroke. The DASH eating plan may also help with weight loss. What are tips for following this plan?  General guidelines  Avoid eating more than 2,300 mg (milligrams) of salt (sodium) a day. If you have hypertension, you may need to reduce your sodium intake to 1,500 mg a day.  Limit alcohol intake to no more than 1 drink a day for nonpregnant women and 2 drinks a day for men. One drink equals 12 oz of beer, 5 oz of wine, or 1 oz of hard liquor.  Work with your health care provider to maintain a healthy body weight or to lose weight. Ask what an ideal weight is for you.  Get at least 30 minutes of exercise that causes your heart to beat faster (aerobic exercise) most days of the week. Activities may include walking, swimming, or biking.  Work with your health care provider or diet and nutrition specialist (dietitian) to adjust your eating plan to your individual calorie needs. Reading food labels   Check food labels for the amount of sodium per serving. Choose foods with less than 5 percent of the Daily Value of sodium. Generally, foods with less than 300 mg of sodium per serving fit into this eating plan.  To find whole grains, look for the word "whole" as the first word in the ingredient list. Shopping  Buy products labeled as "low-sodium" or "no salt added."  Buy fresh  foods. Avoid canned foods and premade or frozen meals. Cooking  Avoid adding salt when cooking. Use salt-free seasonings or herbs instead of table salt or sea salt. Check with your health care provider or pharmacist before using salt substitutes.  Do not fry foods. Cook foods using healthy methods such as baking, boiling, grilling, and broiling instead.  Cook with heart-healthy oils, such as olive, canola, soybean, or sunflower oil. Meal planning  Eat a balanced diet that includes: ? 5 or more servings of fruits and vegetables each day. At each meal, try to fill half of your plate with fruits and vegetables. ? Up to 6-8 servings of whole grains each day. ? Less than 6 oz of lean meat, poultry, or fish each day. A 3-oz serving of meat is about the same size as a deck of cards. One egg equals 1 oz. ? 2 servings of low-fat dairy each day. ? A serving of nuts, seeds, or beans 5 times each week. ? Heart-healthy fats. Healthy fats called Omega-3 fatty acids are found in foods such as flaxseeds and coldwater fish, like sardines, salmon, and mackerel.  Limit how much you eat of the following: ? Canned or prepackaged foods. ? Food that is high in trans fat, such as fried foods. ? Food that is high in saturated fat, such as fatty meat. ? Sweets, desserts, sugary drinks, and other foods with added sugar. ?  Full-fat dairy products.  Do not salt foods before eating.  Try to eat at least 2 vegetarian meals each week.  Eat more home-cooked food and less restaurant, buffet, and fast food.  When eating at a restaurant, ask that your food be prepared with less salt or no salt, if possible. What foods are recommended? The items listed may not be a complete list. Talk with your dietitian about what dietary choices are best for you. Grains Whole-grain or whole-wheat bread. Whole-grain or whole-wheat pasta. Brown rice. [place]. Bulgur. Whole-grain and low-sodium cereals. Pita bread. Low-fat,  low-sodium crackers. Whole-wheat flour tortillas. Vegetables Fresh or frozen vegetables (raw, steamed, roasted, or grilled). Low-sodium or reduced-sodium tomato and vegetable juice. Low-sodium or reduced-sodium tomato sauce and tomato paste. Low-sodium or reduced-sodium canned vegetables. Fruits All fresh, dried, or frozen fruit. Canned fruit in natural juice (without added sugar). Meat and other protein foods Skinless chicken or [place]. Ground chicken or [place]. Pork with fat trimmed off. Fish and seafood. Egg whites. Dried beans, peas, or lentils. Unsalted nuts, nut butters, and seeds. Unsalted canned beans. Lean cuts of beef with fat trimmed off. Low-sodium, lean deli meat. Dairy Low-fat (1%) or fat-free (skim) milk. Fat-free, low-fat, or reduced-fat cheeses. Nonfat, low-sodium ricotta or cottage cheese. Low-fat or nonfat yogurt. Low-fat, low-sodium cheese. Fats and oils Soft margarine without trans fats. Vegetable oil. Low-fat, reduced-fat, or light mayonnaise and salad dressings (reduced-sodium). Canola, safflower, olive, soybean, and sunflower oils. Avocado. Seasoning and other foods Herbs. Spices. Seasoning mixes without salt. Unsalted popcorn and pretzels. Fat-free sweets. What foods are not recommended? The items listed may not be a complete list. Talk with your dietitian about what dietary choices are best for you. Grains Baked goods made with fat, such as croissants, muffins, or some breads. Dry pasta or rice meal packs. Vegetables Creamed or fried vegetables. Vegetables in a cheese sauce. Regular canned vegetables (not low-sodium or reduced-sodium). Regular canned tomato sauce and paste (not low-sodium or reduced-sodium). Regular tomato and vegetable juice (not low-sodium or reduced-sodium). Angie Fava. Olives. Fruits Canned fruit in a light or heavy syrup. Fried fruit. Fruit in cream or butter sauce. Meat and other protein foods Fatty cuts of meat. Ribs. Fried meat. Berniece Salines. Sausage.  Bologna and other processed lunch meats. Salami. Fatback. Hotdogs. Bratwurst. Salted nuts and seeds. Canned beans with added salt. Canned or smoked fish. Whole eggs or egg yolks. Chicken or [place] with skin. Dairy Whole or 2% milk, cream, and half-and-half. Whole or full-fat cream cheese. Whole-fat or sweetened yogurt. Full-fat cheese. Nondairy creamers. Whipped toppings. Processed cheese and cheese spreads. Fats and oils Butter. Stick margarine. Lard. Shortening. Ghee. Bacon fat. Tropical oils, such as coconut, palm kernel, or palm oil. Seasoning and other foods Salted popcorn and pretzels. Onion salt, garlic salt, seasoned salt, table salt, and sea salt. Worcestershire sauce. Tartar sauce. Barbecue sauce. Teriyaki sauce. Soy sauce, including reduced-sodium. Steak sauce. Canned and packaged gravies. Fish sauce. Oyster sauce. Cocktail sauce. Horseradish that you find on the shelf. Ketchup. Mustard. Meat flavorings and tenderizers. Bouillon cubes. Hot sauce and Tabasco sauce. Premade or packaged marinades. Premade or packaged taco seasonings. Relishes. Regular salad dressings. Where to find more information:  National Heart, Lung, and [place]: https://wilson-eaton.com/  American Heart Association: www.heart.org Summary  The DASH eating plan is a healthy eating plan that has been shown to reduce high blood pressure (hypertension). It may also reduce your risk for type 2 diabetes, heart disease, and stroke.  With the DASH eating plan, you  should limit salt (sodium) intake to 2,300 mg a day. If you have hypertension, you may need to reduce your sodium intake to 1,500 mg a day.  When on the DASH eating plan, aim to eat more fresh fruits and vegetables, whole grains, lean proteins, low-fat dairy, and heart-healthy fats.  Work with your health care provider or diet and nutrition specialist (dietitian) to adjust your eating plan to your individual calorie needs. This information is not intended to  replace advice given to you by your health care provider. Make sure you discuss any questions you have with your health care provider. Document Released: 10/10/2011 Document Revised: 10/03/2017 Document Reviewed: 10/14/2016 Elsevier Patient Education  2020 [employer].

## 2021-01-31 LAB — BASIC METABOLIC PANEL
BUN/Creatinine Ratio: 19 (ref 9–23)
BUN: 20 mg/dL (ref 6–24)
CO2: 23 mmol/L (ref 20–29)
Calcium: 9.7 mg/dL (ref 8.7–10.2)
Chloride: 99 mmol/L (ref 96–106)
Creatinine, Ser: 1.06 mg/dL — ABNORMAL HIGH (ref 0.57–1.00)
Glucose: 74 mg/dL (ref 65–99)
Potassium: 3.7 mmol/L (ref 3.5–5.2)
Sodium: 139 mmol/L (ref 134–144)
eGFR: 68 mL/min/{1.73_m2} (ref >59)

## 2021-02-23 ENCOUNTER — Encounter: Payer: Self-pay | Admitting: Pharmacist

## 2021-02-23 ENCOUNTER — Ambulatory Visit (INDEPENDENT_AMBULATORY_CARE_PROVIDER_SITE_OTHER): Payer: 59 | Admitting: Pharmacist

## 2021-02-23 ENCOUNTER — Other Ambulatory Visit: Payer: Self-pay

## 2021-02-23 VITALS — BP 132/90 | HR 56 | Ht 64.0 in | Wt 184.6 lb

## 2021-02-23 DIAGNOSIS — I10 Essential (primary) hypertension: Secondary | ICD-10-CM | POA: Diagnosis not present

## 2021-02-23 MED ORDER — CHLORTHALIDONE 25 MG PO TABS: 25.0000 mg | ORAL_TABLET | Freq: Every day | ORAL | 1 refills | Status: DC

## 2021-02-23 NOTE — Patient Instructions (Addendum)
Return for a follow up appointment in 4 weeks  Go to the lab in 3 WEEKS  Check your blood pressure at home daily (if able) and keep record of the readings.  Take your BP meds as follows: *STOP TAKING HCTZ *START TAKING CHLORTHALIDONE 25MG  DAILY*  *clinic phone number [phone]  Bring all of your meds, your BP cuff and your record of home blood pressures to your next appointment.  Exercise as you're able, try to walk approximately 30 minutes per day.  Keep salt intake to a minimum, especially watch canned and prepared boxed foods.  Eat more fresh fruits and vegetables and fewer canned items.  Avoid eating in fast food restaurants.    HOW TO TAKE YOUR BLOOD PRESSURE: . Rest 5 minutes before taking your blood pressure. .  Don't smoke or drink caffeinated beverages for at least 30 minutes before. . Take your blood pressure before (not after) you eat. . Sit comfortably with your back supported and both feet on the floor (don't cross your legs). . Elevate your arm to heart level on a table or a desk. . Use the proper sized cuff. It should fit smoothly and snugly around your bare upper arm. There should be enough room to slip a fingertip under the cuff. The bottom edge of the cuff should be 1 inch above the crease of the elbow. . Ideally, take 3 measurements at one sitting and record the average.

## 2021-02-23 NOTE — Assessment & Plan Note (Addendum)
Today's blood pressure of 132/90 shows a blood pressure near goal. Per ACC/AHA 2020 guidelines, the patient's goal blood pressure should be less than 130/80. Pt is currently being managed with spironolactone 25 mg daily and HCTZ 50 mg daily. Pt states she takes her medications daily and denies any adverse effects to current medication regimen.   Pt has been on HCTZ for several years. Due to chlorthalidone lasting longer in the body than HCTZ. It would be a better option to bring patient to blood pressure goal.  Stopped HCTZ 50 mg daily and will start chlorthalidone 25 mg daily.  Assessed and demonstrated appropriate blood pressure technique with pt's home BP monitor at visit   Pt was encouraged to continue lifestyle modifications such as exercise and limiting sodium intake.  Follow-up visit in 4 weeks

## 2021-02-23 NOTE — Progress Notes (Signed)
HPI:  Stephanie Hays is a 40 y.o. female patient of Dr Duke Salvia, with a PMH of hypertension and endometriosis who presents today for hypertension clinic evaluation.  Pt was diagnosed with hypertension during pregnancy that never returned to baseline. While pregnant she was on labetalol. She subsequently on losartan/valsartan which were discontinued due to recalls and tapered once she lost weight.  She did not tolerate amlodipine (developed headaches), and her blood pressure was high when she was switched from HCTZ to spironolactone. At last office visit on 01/22/21 with Dr. Duke Salvia, she was initiated back on spironolactone 25 mg daily. She currently takes HCTZ 50 mg daily. Her blood pressure at the visit was elevated above goal at 160/106.  Blood Pressure Goal:  <130/80 per ACC/AHA guidelines  Current Medications: Spironolactone 25 mg daily, Hydrochlorothiazide 50 mg daily  Family Hx: Mother - HTN, maternal grandmother - heart attack, brother - HTN  Social Hx: Denies alcohol and tobacco use  Diet: She cooks at home and limits her sodium intake.  She also follows a low-carb diet and most recently has been trying to limit she is because she knows this has a lot of salt.  Exercise: 5 days a week doing cardio and strength training for 30 minutes - 1 hours  Home BP readings:  21 readings in am - average 129/82 , HR range 68-70 bpm  19 readings in pm - average 126/79 , HR range 53-73 bpm  Relion arm cuff - determined to be accurate within from manual reading  Intolerances: amlodipine - headache  Labs: 01-30-21: Glu - 74, Scr - 1.06, eGFR - 68, Na - 139, K-3.7   Wt Readings from Last 3 Encounters:  02/23/21 184 lb 9.6 oz (83.7 kg)  01/22/21 184 lb 3.2 oz (83.6 kg)  10/23/15 267 lb 12.8 oz (121.5 kg)   BP Readings from Last 3 Encounters:  02/23/21 132/90  01/22/21 (!) 160/106  10/24/15 (!) 162/94   Pulse Readings from Last 3 Encounters:  02/23/21 (!) 56  01/22/21 (!) 55   10/24/15 70    Current Outpatient Medications  Medication Sig Dispense Refill  . chlorthalidone (HYGROTON) 25 MG tablet Take 1 tablet (25 mg total) by mouth daily. 30 tablet 1  . spironolactone (ALDACTONE) 25 MG tablet Take 1 tablet (25 mg total) by mouth daily. 90 tablet 1   No current facility-administered medications for this visit.    No Known Allergies  Past Medical History:  Diagnosis Date  . Endometriosis   . Essential hypertension 01/22/2021  . Headache   . Hypertension    takes HCTZ and Valsartan daily  . Hypokalemia 01/22/2021  . Nocturia   . PONV (postoperative nausea and vomiting)    "only happened because they had to give me extra anesthesia wmy last c-section; I had pre-eclampsia"    Blood pressure 132/90, pulse (!) 56, height 5\' 4"  (1.626 m), weight 184 lb 9.6 oz (83.7 kg), SpO2 99 %.  Essential hypertension Today's blood pressure of 132/90 shows a blood pressure near goal. Per ACC/AHA 2020 guidelines, the patient's goal blood pressure should be less than 130/80. Pt is currently being managed with spironolactone 25 mg daily and HCTZ 50 mg daily. Pt states she takes her medications daily and denies any adverse effects to current medication regimen.   Pt has been on HCTZ for several years. Due to chlorthalidone lasting longer in the body than HCTZ. It would be a better option to bring patient to blood pressure goal.  Stopped HCTZ 50 mg daily and will start chlorthalidone 25 mg daily.  Assessed and demonstrated appropriate blood pressure technique with pt's home BP monitor at visit   Pt was encouraged to continue lifestyle modifications such as exercise and limiting sodium intake.  Follow-up visit in 4 weeks   Rolene Course, PharmD candidate 2022 [place]    Present during [place] and discussed plan with: Harrington Challenger, PharmD CPP [facility] [address] [place],  [place] [phone]

## 2021-03-12 ENCOUNTER — Telehealth: Payer: Self-pay

## 2021-03-12 NOTE — Telephone Encounter (Signed)
m pt that labs are needed prior to next appt

## 2021-03-17 ENCOUNTER — Other Ambulatory Visit: Payer: Self-pay | Admitting: Cardiovascular Disease

## 2021-03-22 LAB — BASIC METABOLIC PANEL
BUN/Creatinine Ratio: 21 (ref 9–23)
BUN: 21 mg/dL (ref 6–24)
CO2: 23 mmol/L (ref 20–29)
Calcium: 9.9 mg/dL (ref 8.7–10.2)
Chloride: 99 mmol/L (ref 96–106)
Creatinine, Ser: 0.98 mg/dL (ref 0.57–1.00)
Glucose: 75 mg/dL (ref 65–99)
Potassium: 3.4 mmol/L — ABNORMAL LOW (ref 3.5–5.2)
Sodium: 139 mmol/L (ref 134–144)
eGFR: 75 mL/min/{1.73_m2} (ref >59)

## 2021-03-26 ENCOUNTER — Telehealth: Payer: Self-pay | Admitting: *Deleted

## 2021-03-26 DIAGNOSIS — Z5181 Encounter for therapeutic drug level monitoring: Secondary | ICD-10-CM

## 2021-03-26 DIAGNOSIS — I10 Essential (primary) hypertension: Secondary | ICD-10-CM

## 2021-03-26 DIAGNOSIS — E876 Hypokalemia: Secondary | ICD-10-CM

## 2021-03-26 MED ORDER — SPIRONOLACTONE 25 MG PO TABS: ORAL_TABLET | ORAL | 1 refills | Status: DC

## 2021-03-26 NOTE — Telephone Encounter (Signed)
-----   Message from Chilton Si, MD sent at 03/25/2021 12:32 PM EDT ----- Potassium is little low but labs are otherwise normal.  Increase spironolactone to 37.5mg  .  Keep checking blood pressure and repeat BMP in a week or 2.

## 2021-03-26 NOTE — Telephone Encounter (Signed)
Advised patient, verbalized understanding  

## 2021-03-26 NOTE — Addendum Note (Signed)
Addended by: Regis Bill B on: 03/26/2021 02:23 PM   Modules accepted: Orders

## 2021-03-30 ENCOUNTER — Other Ambulatory Visit: Payer: Self-pay

## 2021-03-30 ENCOUNTER — Ambulatory Visit (INDEPENDENT_AMBULATORY_CARE_PROVIDER_SITE_OTHER): Payer: 59 | Admitting: Pharmacist Clinician (PhC)/ Clinical Pharmacy Specialist

## 2021-03-30 DIAGNOSIS — I10 Essential (primary) hypertension: Secondary | ICD-10-CM

## 2021-03-30 NOTE — Patient Instructions (Signed)
Return for a a follow up appointment Friday July 8 at 11:30  Go to the lab in 10 days to check potassium levels  Check your blood pressure at home daily (if able) and keep record of the readings.  Take your BP meds as follows:  Cut spironolactone back to 25 mg (1 tablet) daily  Continue chlorthalidone 25 mg  Increase your potassium intake thru foods - broccoli, spinach, bananas and such.  Bring all of your meds, your BP cuff and your record of home blood pressures to your next appointment.  Exercise as you're able, try to walk approximately 30 minutes per day.  Keep salt intake to a minimum, especially watch canned and prepared boxed foods.  Eat more fresh fruits and vegetables and fewer canned items.  Avoid eating in fast food restaurants.    HOW TO TAKE YOUR BLOOD PRESSURE: . Rest 5 minutes before taking your blood pressure. .  Don't smoke or drink caffeinated beverages for at least 30 minutes before. . Take your blood pressure before (not after) you eat. . Sit comfortably with your back supported and both feet on the floor (don't cross your legs). . Elevate your arm to heart level on a table or a desk. . Use the proper sized cuff. It should fit smoothly and snugly around your bare upper arm. There should be enough room to slip a fingertip under the cuff. The bottom edge of the cuff should be 1 inch above the crease of the elbow. . Ideally, take 3 measurements at one sitting and record the average.

## 2021-03-30 NOTE — Assessment & Plan Note (Signed)
Patient with essential hypertension, doing well based on home readings.  Is slightly elevated in the office today, but she notes that she takes meds around 10-11 am and has not yet had them today.  Will have her cut spironolactone back to 1 tablet daily and continue chlorthalidone.   Continue to monitor home BP readings and return for follow up in 6 weeks.  Patient aware to increase potassium rich foods in her diet and will repeat metabolic panel in 2 weeks.

## 2021-03-30 NOTE — Progress Notes (Signed)
HPI:  Stephanie Hays is a 40 y.o. female patient of Dr Duke Salvia, with a PMH of hypertension and endometriosis who presents today for hypertension clinic evaluation.  Pt was diagnosed with hypertension during pregnancy that never returned to baseline. While pregnant she was on labetalol. She subsequently on losartan/valsartan which were discontinued due to recalls and tapered once she lost weight.  She did not tolerate amlodipine (developed headaches), and her blood pressure was high when she was switched from HCTZ to spironolactone. At last office visit on 01/22/21 with Dr. Duke Salvia, she was initiated back on spironolactone 25 mg daily.  At a follow up with Raquel Penni Homans her diastolic pressure was still elevated and the hctz 50 mg was switched to chlorthalidone 25 mg.    She returns today for follow up.  Recent blood work showed her potassium to be at 3.4.   Dr. Duke Salvia increased the spironolactone from 25 to 37.5 mg daily.  Since increasing the dose patient reports feeling fuzzy and out of sorts.  Would like to go back to just the 25 mg dose.  Otherwise she has no complaints and had no problems switching from hctz to chlorthalidone.    Blood Pressure Goal:  <130/80   Current Medications: Spironolactone 25 mg daily, chlorthalidone 25 mg qd  Family Hx: Mother - HTN, maternal grandmother - heart attack, brother - HTN  Social Hx: Denies alcohol and tobacco use, no caffeine  Diet: She cooks at home and limits her sodium intake.  She also follows a low-carb diet and most recently has been trying to limit she is because she knows this has a lot of salt.; does eat out some - salads, [employer]  Exercise: 4-5 days a week doing cardio and strength training for 30 minutes - 1 hours (4 cardio, 2-3 strength training  Home BP readings:  14 readings in past month average 121/75 (HR range 57-72)  21 readings in am - average 129/82 , HR range 68-70 bpm  19 readings in pm - average 126/79 , HR range  53-73 bpm  Relion arm cuff - determined to be accurate within from manual reading  Intolerances: amlodipine - headache  Labs:  03/21/21:  Na 139, K 3.4, Glu 75, BUN 21, SCr 0.98 75  01-30-21:  Glu - 74, Scr - 1.06, eGFR - 68, Na - 139, K-3.7   Wt Readings from Last 3 Encounters:  03/30/21 183 lb (83 kg)  02/23/21 184 lb 9.6 oz (83.7 kg)  01/22/21 184 lb 3.2 oz (83.6 kg)   BP Readings from Last 3 Encounters:  03/30/21 132/90  02/23/21 132/90  01/22/21 (!) 160/106   Pulse Readings from Last 3 Encounters:  03/30/21 62  02/23/21 (!) 56  01/22/21 (!) 55    Current Outpatient Medications  Medication Sig Dispense Refill  . chlorthalidone (HYGROTON) 25 MG tablet TAKE 1 TABLET (25 MG TOTAL) BY MOUTH DAILY. 30 tablet 10  . spironolactone (ALDACTONE) 25 MG tablet TAKE 1 AND 1/2 TABLETS DAILY 90 tablet 1   No current facility-administered medications for this visit.    No Known Allergies  Past Medical History:  Diagnosis Date  . Endometriosis   . Essential hypertension 01/22/2021  . Headache   . Hypertension    takes HCTZ and Valsartan daily  . Hypokalemia 01/22/2021  . Nocturia   . PONV (postoperative nausea and vomiting)    "only happened because they had to give me extra anesthesia wmy last c-section; I had pre-eclampsia"  Blood pressure 132/90, pulse 62, height $RemoveBe'5\' 4"'hEzRjrnpn$  (1.626 m), weight 183 lb (83 kg).  Essential hypertension Patient with essential hypertension, doing well based on home readings.  Is slightly elevated in the office today, but she notes that she takes meds around 10-11 am and has not yet had them today.  Will have her cut spironolactone back to 1 tablet daily and continue chlorthalidone.   Continue to monitor home BP readings and return for follow up in 6 weeks.  Patient aware to increase potassium rich foods in her diet and will repeat metabolic panel in 2 weeks.     Tommy Medal PharmD CPP [facility] [address] [place], [place] [address] [phone]

## 2021-04-16 ENCOUNTER — Telehealth: Payer: Self-pay | Admitting: Cardiovascular Disease

## 2021-04-16 NOTE — Telephone Encounter (Signed)
Patient stated she was returning [place] call. Please advise

## 2021-04-16 NOTE — Telephone Encounter (Signed)
This RN attempted to call patient back, call went to voicemail. Left patient nonspecific voicemail to return office call.

## 2021-04-24 NOTE — Telephone Encounter (Signed)
Called patient, she states she received the information from [place].  Patient has no other questions at this time.  Will remove from triage.

## 2021-05-10 LAB — BASIC METABOLIC PANEL
BUN/Creatinine Ratio: 16 (ref 9–23)
BUN: 16 mg/dL (ref 6–24)
CO2: 26 mmol/L (ref 20–29)
Calcium: 9.8 mg/dL (ref 8.7–10.2)
Chloride: 97 mmol/L (ref 96–106)
Creatinine, Ser: 1.01 mg/dL — ABNORMAL HIGH (ref 0.57–1.00)
Glucose: 71 mg/dL (ref 65–99)
Potassium: 3 mmol/L — ABNORMAL LOW (ref 3.5–5.2)
Sodium: 136 mmol/L (ref 134–144)
eGFR: 72 mL/min/{1.73_m2} (ref >59)

## 2021-05-11 ENCOUNTER — Telehealth: Payer: Self-pay | Admitting: *Deleted

## 2021-05-11 ENCOUNTER — Ambulatory Visit: Payer: 59

## 2021-05-11 ENCOUNTER — Other Ambulatory Visit: Payer: Self-pay

## 2021-05-11 ENCOUNTER — Ambulatory Visit (INDEPENDENT_AMBULATORY_CARE_PROVIDER_SITE_OTHER): Payer: 59 | Admitting: Pharmacist Clinician (PhC)/ Clinical Pharmacy Specialist

## 2021-05-11 DIAGNOSIS — E876 Hypokalemia: Secondary | ICD-10-CM

## 2021-05-11 DIAGNOSIS — I10 Essential (primary) hypertension: Secondary | ICD-10-CM | POA: Diagnosis not present

## 2021-05-11 DIAGNOSIS — Z5181 Encounter for therapeutic drug level monitoring: Secondary | ICD-10-CM

## 2021-05-11 MED ORDER — POTASSIUM CHLORIDE CRYS ER 20 MEQ PO TBCR: 40.0000 meq | EXTENDED_RELEASE_TABLET | Freq: Every day | ORAL | 1 refills | Status: DC

## 2021-05-11 NOTE — Progress Notes (Signed)
HPI:  Stephanie Hays is a 40 y.o. female patient of Dr Oval Linsey, with a PMH of hypertension and endometriosis who presents today for hypertension clinic evaluation.  Pt was diagnosed with hypertension during pregnancy that never returned to baseline. While pregnant she was on labetalol. She subsequently on losartan/valsartan which were discontinued due to recalls and tapered once she lost weight.  She did not tolerate amlodipine (developed headaches), and her blood pressure was high when she was switched from HCTZ to spironolactone. Later chlorthalidone was added.  Results of a recent lab draw showed potassium low at 3.4 and the spironolactone was increased to 37.5 mg, however patient reported this made her feel "fuzzy and out of sorts" so at her last visit we decreased this back to 25 mg daily.    She continued to monitor home readings and returns today for follow up.    States she is feeling much better since cutting the spironolactone back to 25 mg daily.  Home BP readings are showing mostly WNL.  No complaints or concerns today.  Her potassium level dropped after cutting back on spironolactone, and now she is taking potassium chloride 40 mEq daily.  Blood Pressure Goal:  <130/80   Current Medications: Spironolactone 25 mg daily, chlorthalidone 25 mg qd  Family Hx: Mother - HTN, maternal grandmother - heart attack, brother - HTN  Social Hx: Denies alcohol and tobacco use, no caffeine  Diet: She cooks at home and limits her sodium intake.  She also follows a low-carb diet and most recently has been trying to limit she is because she knows this has a lot of salt.; does eat out some - salads, [employer]  Exercise: 4-5 days a week doing cardio and strength training for 30 minutes - 1 hours (4 days of cardio, 2-3 days of strength training  Home BP readings:  14 readings in past month average 121/75 (HR range 57-72)  21 readings in am - average 129/82 , HR range 68-70 bpm  19 readings in pm - average  126/79 , HR range 53-73 bpm  Relion arm cuff - determined to be accurate within 6mHg from manual reading  Intolerances: amlodipine - headache  Labs:  03/21/21:  Na 139, K 3.4, Glu 75, BUN 21, SCr 0.98 75  01-30-21:  Glu - 74, Scr - 1.06, eGFR - 68, Na - 139, K-3.7   Wt Readings from Last 3 Encounters:  05/11/21 191 lb (86.6 kg)  03/30/21 183 lb (83 kg)  02/23/21 184 lb 9.6 oz (83.7 kg)   BP Readings from Last 3 Encounters:  05/11/21 122/78  03/30/21 132/90  02/23/21 132/90   Pulse Readings from Last 3 Encounters:  05/11/21 67  03/30/21 62  02/23/21 (!) 56    Current Outpatient Medications  Medication Sig Dispense Refill   chlorthalidone (HYGROTON) 25 MG tablet TAKE 1 TABLET (25 MG TOTAL) BY MOUTH DAILY. 30 tablet 10   potassium chloride SA (KLOR-CON) 20 MEQ tablet Take 2 tablets (40 mEq total) by mouth daily. 180 tablet 1   spironolactone (ALDACTONE) 25 MG tablet TAKE 1 AND 1/2 TABLETS DAILY (Patient taking differently: Take 25 mg by mouth daily. TAKE 1 TABLET DAILY) 90 tablet 1   No current facility-administered medications for this visit.    Allergies  Allergen Reactions   Verapamil     Other reaction(s): Dizziness (intolerance) Also caused facial numbness Also caused facial numbness    Amlodipine Other (See Comments)    Other reaction(s): Lethargy (intolerance)  Past Medical History:  Diagnosis Date   Endometriosis    Essential hypertension 01/22/2021   Headache    Hypertension    takes HCTZ and Valsartan daily   Hypokalemia 01/22/2021   Nocturia    PONV (postoperative nausea and vomiting)    "only happened because they had to give me extra anesthesia wmy last c-section; I had pre-eclampsia"    Blood pressure 122/78, pulse 67, resp. rate 16, height _0  (1.626 m), weight 191 lb (86.6 kg), last menstrual period 04/18/2021, SpO2 96 %.  Essential hypertension Patient with essential hypertension, doing much better on current regimen.  Will not make any  changes at this time.  Patient aware that she should continue to monitor home BP readings and follow up with Dr. Oval Linsey in 6 months.     Tommy Medal PharmD CPP [facility] [address] [place], [place] [address] [phone]

## 2021-05-11 NOTE — Telephone Encounter (Signed)
Advised patient of lab results, lab orders placed, and sent Rx to pharmacy

## 2021-05-11 NOTE — Telephone Encounter (Signed)
-----   Message from Chilton Si, MD sent at 05/11/2021  1:11 AM EDT ----- Normal kidney function.  Add potassium chloride 40 mEq daily.  Check BMP in a week.

## 2021-05-11 NOTE — Patient Instructions (Addendum)
Return for a a follow up appointment with Dr. Duke Salvia in 6 months  (January 2023)  Check your blood pressure at home 2-3 times each week, and keep record of the readings.  Take your BP meds as follows:  Continue with current medications.   Bring all of your meds, your BP cuff and your record of home blood pressures to your next appointment.  Exercise as you're able, try to walk approximately 30 minutes per day.  Keep salt intake to a minimum, especially watch canned and prepared boxed foods.  Eat more fresh fruits and vegetables and fewer canned items.  Avoid eating in fast food restaurants.    HOW TO TAKE YOUR BLOOD PRESSURE: Rest 5 minutes before taking your blood pressure.  Don't smoke or drink caffeinated beverages for at least 30 minutes before. Take your blood pressure before (not after) you eat. Sit comfortably with your back supported and both feet on the floor (don't cross your legs). Elevate your arm to heart level on a table or a desk. Use the proper sized cuff. It should fit smoothly and snugly around your bare upper arm. There should be enough room to slip a fingertip under the cuff. The bottom edge of the cuff should be 1 inch above the crease of the elbow. Ideally, take 3 measurements at one sitting and record the average.

## 2021-05-15 NOTE — Assessment & Plan Note (Signed)
Patient with essential hypertension, doing much better on current regimen.  Will not make any changes at this time.  Patient aware that she should continue to monitor home BP readings and follow up with Dr. Duke Salvia in 6 months.

## 2021-05-23 LAB — BASIC METABOLIC PANEL
BUN/Creatinine Ratio: 20 (ref 9–23)
BUN: 20 mg/dL (ref 6–24)
CO2: 26 mmol/L (ref 20–29)
Calcium: 9.8 mg/dL (ref 8.7–10.2)
Chloride: 99 mmol/L (ref 96–106)
Creatinine, Ser: 0.99 mg/dL (ref 0.57–1.00)
Glucose: 75 mg/dL (ref 65–99)
Potassium: 3.9 mmol/L (ref 3.5–5.2)
Sodium: 138 mmol/L (ref 134–144)
eGFR: 74 mL/min/{1.73_m2} (ref >59)

## 2021-07-17 ENCOUNTER — Other Ambulatory Visit: Payer: Self-pay | Admitting: Cardiovascular Disease

## 2021-07-18 ENCOUNTER — Telehealth (HOSPITAL_BASED_OUTPATIENT_CLINIC_OR_DEPARTMENT_OTHER): Payer: Self-pay | Admitting: Cardiovascular Disease

## 2021-07-18 MED ORDER — SPIRONOLACTONE 25 MG PO TABS: ORAL_TABLET | ORAL | 1 refills | Status: DC

## 2021-07-18 MED ORDER — POTASSIUM CHLORIDE CRYS ER 20 MEQ PO TBCR: 40.0000 meq | EXTENDED_RELEASE_TABLET | Freq: Every day | ORAL | 1 refills | Status: DC

## 2021-07-18 MED ORDER — CHLORTHALIDONE 25 MG PO TABS: 25.0000 mg | ORAL_TABLET | Freq: Every day | ORAL | 1 refills | Status: DC

## 2021-07-18 NOTE — Telephone Encounter (Signed)
Received fax from [facility]. Pt would like medications to be sent there for refill. Sent 90 day refills of all meds to [facility].

## 2021-07-20 ENCOUNTER — Other Ambulatory Visit: Payer: Self-pay | Admitting: *Deleted

## 2021-07-20 MED ORDER — CHLORTHALIDONE 25 MG PO TABS: 25.0000 mg | ORAL_TABLET | Freq: Every day | ORAL | 1 refills | Status: DC

## 2021-07-20 MED ORDER — SPIRONOLACTONE 25 MG PO TABS: ORAL_TABLET | ORAL | 1 refills | Status: DC

## 2021-07-20 MED ORDER — POTASSIUM CHLORIDE CRYS ER 20 MEQ PO TBCR: 40.0000 meq | EXTENDED_RELEASE_TABLET | Freq: Every day | ORAL | 1 refills | Status: DC

## 2021-07-20 NOTE — Telephone Encounter (Signed)
Rx(s) sent to pharmacy electronically.  

## 2021-12-19 ENCOUNTER — Ambulatory Visit (INDEPENDENT_AMBULATORY_CARE_PROVIDER_SITE_OTHER): Payer: 59 | Admitting: Cardiovascular Disease

## 2021-12-19 ENCOUNTER — Encounter (HOSPITAL_BASED_OUTPATIENT_CLINIC_OR_DEPARTMENT_OTHER): Payer: Self-pay | Admitting: Cardiovascular Disease

## 2021-12-19 ENCOUNTER — Other Ambulatory Visit: Payer: Self-pay

## 2021-12-19 VITALS — BP 126/76 | HR 65 | Ht 64.0 in | Wt 188.8 lb

## 2021-12-19 DIAGNOSIS — I10 Essential (primary) hypertension: Secondary | ICD-10-CM

## 2021-12-19 DIAGNOSIS — R0789 Other chest pain: Secondary | ICD-10-CM | POA: Diagnosis not present

## 2021-12-19 DIAGNOSIS — E876 Hypokalemia: Secondary | ICD-10-CM

## 2021-12-19 DIAGNOSIS — R079 Chest pain, unspecified: Secondary | ICD-10-CM | POA: Insufficient documentation

## 2021-12-19 DIAGNOSIS — E66811 Obesity, class 1: Secondary | ICD-10-CM

## 2021-12-19 DIAGNOSIS — E669 Obesity, unspecified: Secondary | ICD-10-CM | POA: Diagnosis not present

## 2021-12-19 HISTORY — DX: Other chest pain: R07.89

## 2021-12-19 HISTORY — DX: Obesity, class 1: E66.811

## 2021-12-19 HISTORY — DX: Obesity, unspecified: E66.9

## 2021-12-19 NOTE — Assessment & Plan Note (Signed)
Symptoms are very atypical.  They never occur with exertion and she reports that the sharp pains.  No plans for further ischemic evaluation at this time.  She was reassured that this is unlikely to be coming from her heart

## 2021-12-19 NOTE — Assessment & Plan Note (Addendum)
She is exercising regularly.  Continue current efforts.  She reportedly had lipids and CMP checked with her OB/GYN and they were normal limits.

## 2021-12-19 NOTE — Assessment & Plan Note (Addendum)
Blood pressures are much better since starting spironolactone.  She is tolerating it well.  Continue chlorthalidone and spironolactone.  Continue to exercise and limit sodium intake.

## 2021-12-19 NOTE — Patient Instructions (Signed)
Medication Instructions:  .Your physician recommends that you continue on your current medications as directed. Please refer to the Current Medication list given to you today.  Labwork: none  Testing/Procedures: none  Follow-Up: As needed   

## 2021-12-19 NOTE — Progress Notes (Signed)
Advanced Hypertension Clinic Initial Assessment:    Date:  12/19/2021   ID:  Stephanie Hays, DOB 1981/04/28, MRN [id number]  PCP:  Venia Minks, NP  Cardiologist:  None  Nephrologist:  Referring MD: Venia Minks, NP   CC: Hypertension  History of Present Illness:    Stephanie Hays is a 41 y.o. female with a hx of hypertension and endometriosis here for follow-up in the hypertension clinic. Was first seen on 01/22/2021 to establish care.  She was first diagnosed with hypertension during pregnancy.  She developed pre-eclampsia and her BP never returned to baseline.  She saw to cardiologist and nephrologist.  She had an abdominal CT-A 10/2017 that revealed no adenomas and patent renal arteries.  She also had renal artery Dopplers that were negative around the same time.  Thyroid function was normal 03/2020.  She had a sleep study in 2018 that was positive.  She was on a CPAP for years but lost weight and had a repeat study that was negative for OSA.  When pregnant she was on labetalol.  She subsequently on losartan/valsartan which were discontinued due to recalls and tapered once she lost weight.  She did not tolerate amlodipine and her blood pressure was high when she was switched from HCTZ to spironolactone.    At her last visit, she had a very healthy lifestyle. We stopped potassium and added spironolactone. She followed up with our pharmacist and her spironolactone dose was increased due to low potassium however she felt dizzy so it was reduced back to 25 mg. At follow-up, her sugars were better controlled.   Today, she is doing good at his visit. She is still exercising and managing a healthy diet. During exercise, she has no symptoms of chest pain and dyspnea. At home, her blood pressure fluctuates between 117-122. She notes they are never higher than 122 and the diastolic readings are lower. She reports that she gets palpitations once a month that last for no more than a day. During the day,  each episode lasts intermittently for about 15 minutes at a time. No associated dizziness or syncope. She is doing well on 25 mg spironolactone and is pleased with the results.  Initial blood pressure at beginning of visit- 134/90 Recheck- 126/76  She reports she has intermittent, sharp pain at the left side of her chest. Notes that it happens when she is sitting and not during exercise. She is not too worried about it.  She denies chest pain, shortness of breath, palpitations, lightheadedness, headaches, syncope, LE edema, orthopnea, PND.   Previous antihypertensives: Valsartan- recall Losartan- recall, not as effective Labetalol Amlodipine-headaches   Past Medical History:  Diagnosis Date   Atypical chest pain 12/19/2021   Endometriosis    Essential hypertension 01/22/2021   Headache    Hypertension    takes HCTZ and Valsartan daily   Hypokalemia 01/22/2021   Nocturia    Obesity (BMI 30.0-34.9) 12/19/2021   PONV (postoperative nausea and vomiting)    "only happened because they had to give me extra anesthesia wmy last c-section; I had pre-eclampsia"    Past Surgical History:  Procedure Laterality Date   ABDOMINAL WALL DEFECT REPAIR N/A 10/23/2015   Procedure: EXCISION ABDOMINAL WALL MASS;  Surgeon: Jimmye Norman, MD;  Location: [facility] OR;  Service: General;  Laterality: N/A;   CESAREAN SECTION  2001; 2006; 2010; 2013   CESAREAN SECTION  05/02/2012   Procedure: CESAREAN SECTION;  Surgeon: Serita Kyle, MD;  Location: [facility] ORS;  Service: Gynecology;  Laterality: N/A;  Repeat cesarean section with delivery of baby boy at 80. Apgars 9/10.   EXCISION MASS ABDOMINAL  10/23/2015   MASS EXCISION  06/04/2004   of subcutaneous tissue of the abdominal wall/notes 03/19/2011    Current Medications: Current Meds  Medication Sig   chlorthalidone (HYGROTON) 25 MG tablet Take 1 tablet (25 mg total) by mouth daily.   potassium chloride SA (KLOR-CON) 20 MEQ tablet Take 2 tablets (40 mEq  total) by mouth daily.   spironolactone (ALDACTONE) 25 MG tablet TAKE 1 TABLET (25 MG TOTAL) BY MOUTH DAILY.     Allergies:   Verapamil and Amlodipine   Social History   Socioeconomic History   Marital status: Married    Spouse name: Not on file   Number of children: Not on file   Years of education: Not on file   Highest education level: Not on file  Occupational History   Not on file  Tobacco Use   Smoking status: Never   Smokeless tobacco: Never  Substance and Sexual Activity   Alcohol use: No   Drug use: No   Sexual activity: Yes    Birth control/protection: None  Other Topics Concern   Not on file  Social History Narrative   Not on file   Social Determinants of Health   Financial Resource Strain: Low Risk    Difficulty of Paying Living Expenses: Not hard at all  Food Insecurity: No Food Insecurity   Worried About Programme researcher, broadcasting/film/video in the Last Year: Never true   Ran Out of Food in the Last Year: Never true  Transportation Needs: No Transportation Needs   Lack of Transportation (Medical): No   Lack of Transportation (Non-Medical): No  Physical Activity: Sufficiently Active   Days of Exercise per Week: 5 days   Minutes of Exercise per Session: 60 min  Stress: No Stress Concern Present   Feeling of Stress : Not at all  Social Connections: Not on file     Family History: The patient's family history includes Heart attack in her maternal grandmother; Hypertension in her brother, maternal aunt, maternal uncle, and mother; Stroke in her maternal uncle.  ROS:   Please see the history of present illness.    All other systems reviewed and are negative.  EKGs/Labs/Other Studies Reviewed:    EKG:  EKG is ordered today.    12/19/2021: sinus rhythm rate- 60 bpm  01/22/2021: The ekg ordered today demonstrates sinus bradycardia.  Rate 55 bpm.  Recent Labs: 05/22/2021: BUN 20; Creatinine, Ser 0.99; Potassium 3.9; Sodium 138   Recent Lipid Panel No results found  for: CHOL, TRIG, HDL, CHOLHDL, VLDL, LDLCALC, LDLDIRECT  Physical Exam:   VS:  BP 126/76 (BP Location: Left Arm, Patient Position: Sitting)    Pulse 65    Ht 5\' 4"  (1.626 m)    Wt 188 lb 12.8 oz (85.6 kg)    BMI 32.41 kg/m  , BMI Body mass index is 32.41 kg/m. GENERAL:  Well appearing HEENT: Pupils equal round and reactive, fundi not visualized, oral mucosa unremarkable NECK:  No jugular venous distention, waveform within normal limits, carotid upstroke brisk and symmetric, no bruits LUNGS:  Clear to auscultation bilaterally HEART:  RRR.  PMI not displaced or sustained,S1 and S2 within normal limits, no S3, no S4, no clicks, no rubs, no murmurs ABD:  Flat, positive bowel sounds normal in frequency in pitch, no bruits, no rebound, no guarding, no midline pulsatile mass, no  hepatomegaly, no splenomegaly EXT:  2 plus pulses throughout, no edema, no cyanosis no clubbing SKIN:  No rashes no nodules NEURO:  Cranial nerves II through XII grossly intact, motor grossly intact throughout PSYCH:  Cognitively intact, oriented to person place and time   ASSESSMENT:    1. Essential hypertension   2. Hypokalemia   3. Obesity (BMI 30.0-34.9)   4. Atypical chest pain      PLAN:    Essential hypertension Blood pressures are much better since starting spironolactone.  She is tolerating it well.  Continue chlorthalidone and spironolactone.  Continue to exercise and limit sodium intake.   Hypokalemia Stable on spironolactone and potassium.  She was negative for hyperaldosteronism.  Obesity (BMI 30.0-34.9) She is exercising regularly.  Continue current efforts.  She reportedly had lipids and CMP checked with her OB/GYN and they were normal limits.  Atypical chest pain Symptoms are very atypical.  They never occur with exertion and she reports that the sharp pains.  No plans for further ischemic evaluation at this time.  She was reassured that this is unlikely to be coming from her heart     Disposition:    FU with Dr Chilton Si as needed. Graduated from hypertension clinic.   Medication Adjustments/Labs and Tests Ordered: Current medicines are reviewed at length with the patient today.  Concerns regarding medicines are outlined above.  Orders Placed This Encounter  Procedures   EKG 12-Lead   No orders of the defined types were placed in this encounter.   I,Zite Okoli,acting as a Neurosurgeon for [employer], MD.,have documented all relevant documentation on the behalf of Chilton Si, MD,as directed by  Chilton Si, MD while in the presence of Chilton Si, MD.   I, Matayah Reyburn C. Duke Salvia, MD have reviewed all documentation for this visit.  The documentation of the exam, diagnosis, procedures, and orders on 12/19/2021 are all accurate and complete.   Signed, Chilton Si, MD  12/19/2021 9:24 AM    [facility]

## 2021-12-19 NOTE — Assessment & Plan Note (Signed)
Stable on spironolactone and potassium.  She was negative for hyperaldosteronism.

## 2021-12-24 ENCOUNTER — Other Ambulatory Visit (HOSPITAL_BASED_OUTPATIENT_CLINIC_OR_DEPARTMENT_OTHER): Payer: Self-pay | Admitting: Cardiovascular Disease

## 2021-12-24 NOTE — Telephone Encounter (Signed)
Rx(s) sent to pharmacy electronically.  

## 2022-04-19 ENCOUNTER — Encounter (HOSPITAL_BASED_OUTPATIENT_CLINIC_OR_DEPARTMENT_OTHER): Payer: Self-pay | Admitting: Cardiovascular Disease

## 2022-04-19 ENCOUNTER — Ambulatory Visit (INDEPENDENT_AMBULATORY_CARE_PROVIDER_SITE_OTHER): Payer: 59 | Admitting: Cardiovascular Disease

## 2022-04-19 ENCOUNTER — Ambulatory Visit (INDEPENDENT_AMBULATORY_CARE_PROVIDER_SITE_OTHER): Payer: 59

## 2022-04-19 VITALS — BP 128/72 | HR 72 | Ht 64.0 in | Wt 196.8 lb

## 2022-04-19 DIAGNOSIS — I10 Essential (primary) hypertension: Secondary | ICD-10-CM

## 2022-04-19 DIAGNOSIS — R079 Chest pain, unspecified: Secondary | ICD-10-CM

## 2022-04-19 DIAGNOSIS — R011 Cardiac murmur, unspecified: Secondary | ICD-10-CM

## 2022-04-19 LAB — ECHOCARDIOGRAM COMPLETE
AV Mean grad: 4 mmHg
AV Peak grad: 8.9 mmHg
Ao pk vel: 1.49 m/s
Area-P 1/2: 2.62 cm2
Calc EF: 77.9 %
Height: 64 in
S' Lateral: 2.92 cm
Single Plane A2C EF: 73.7 %
Single Plane A4C EF: 81.5 %
Weight: 3148.8 oz

## 2022-04-19 MED ORDER — METOPROLOL TARTRATE 25 MG PO TABS
ORAL_TABLET | ORAL | 1 refills | Status: DC
Start: 2022-04-19 — End: 2022-04-19

## 2022-04-19 MED ORDER — METOPROLOL TARTRATE 25 MG PO TABS: ORAL_TABLET | ORAL | 1 refills | Status: DC

## 2022-04-19 MED ORDER — METOPROLOL TARTRATE 100 MG PO TABS: ORAL_TABLET | ORAL | 0 refills | Status: DC

## 2022-04-19 NOTE — Assessment & Plan Note (Signed)
Blood pressure is very well controlled on her regimen of spironolactone and chlorthalidone.  No changes at this time.

## 2022-04-19 NOTE — Patient Instructions (Addendum)
Medication Instructions:  TAKE METOPROLOL 25 MG 1 TABLET 2 HOURS PRIOR TO CT   *If you need a refill on your cardiac medications before your next appointment, please call your pharmacy*   Lab Work: BMET FEW DAYS PRIOR TO CT  If you have labs (blood work) drawn today and your tests are completely normal, you will receive your results only by: MyChart Message (if you have MyChart) OR A paper copy in the mail If you have any lab test that is abnormal or we need to change your treatment, we will call you to review the results.   Testing/Procedures: Your physician has requested that you have cardiac CT. Cardiac computed tomography (CT) is a painless test that uses an x-ray machine to take clear, detailed pictures of your heart. For further information please visit [email or web address] Please follow instruction sheet as given. THE OFFICE WILL CALL YOU TO ARRANGE ONCE INSURANCE HAS BEEN REVIEWED   Follow-Up: At [facility], you and your health needs are our priority.  As part of our continuing mission to provide you with exceptional heart care, we have created designated Provider Care Teams.  These Care Teams include your primary Cardiologist (physician) and Advanced Practice Providers (APPs -  Physician Assistants and Nurse Practitioners) who all work together to provide you with the care you need, when you need it.  We recommend signing up for the patient portal called "MyChart".  Sign up information is provided on this After Visit Summary.  MyChart is used to connect with patients for Virtual Visits (Telemedicine).  Patients are able to view lab/test results, encounter notes, upcoming appointments, etc.  Non-urgent messages can be sent to your provider as well.   To learn more about what you can do with MyChart, go to [email or web address].    Your next appointment:   1-2  month(s)  The format for your next appointment:   In Person  Provider:   Gillian Shields, NP   Other  Instructions    Your cardiac CT will be scheduled at one of the below locations:   [facility] [address] [place], [place] [address] [phone]  OR  [facility] [address] [place], [place] [address] [phone]  If scheduled at [facility], please arrive at the [facility] Entrance (Entrance C2) of [facility] 30 minutes prior to test start time. You can use the FREE valet parking offered at entrance C (encouraged to control the heart rate for the test)  Proceed to the [facility] Radiology Department (first floor) to check-in and test prep.  All radiology patients and guests should use entrance C2 at [facility], accessed from [facility], even though the hospital's physical address listed is [address]    If scheduled at [facility], please arrive 15 mins early for check-in and test prep.  Please follow these instructions carefully (unless otherwise directed):  Hold all erectile dysfunction medications at least 3 days (72 hrs) prior to test.  On the Night Before the Test: Be sure to Drink plenty of water. Do not consume any caffeinated/decaffeinated beverages or chocolate 12 hours prior to your test. Do not take any antihistamines 12 hours prior to your test. If the patient has contrast allergy: Patient will need a prescription for Prednisone and very clear instructions (as follows): Prednisone 50 mg - take 13 hours prior to test Take another Prednisone 50 mg 7 hours prior to  test Take another Prednisone 50 mg 1 hour prior to test Take Benadryl 50 mg 1 hour prior to test Patient must complete all four doses of above prophylactic medications. Patient will need a ride after test due to Benadryl.  On the Day of the Test: Drink plenty of water until 1 hour prior to the test. Do not eat any food 4 hours prior to the  test. You may take your regular medications prior to the test.  Take metoprolol (Lopressor) two hours prior to test. HOLD Furosemide/Hydrochlorothiazide morning of the test. FEMALES- please wear underwire-free bra if available, avoid dresses & tight clothing      After the Test: Drink plenty of water. After receiving IV contrast, you may experience a mild flushed feeling. This is normal. On occasion, you may experience a mild rash up to 24 hours after the test. This is not dangerous. If this occurs, you can take Benadryl 25 mg and increase your fluid intake. If you experience trouble breathing, this can be serious. If it is severe call 911 IMMEDIATELY. If it is mild, please call our office. If you take any of these medications: Glipizide/Metformin, Avandament, Glucavance, please do not take 48 hours after completing test unless otherwise instructed.  We will call to schedule your test 2-4 weeks out understanding that some insurance companies will need an authorization prior to the service being performed.   For non-scheduling related questions, please contact the cardiac imaging nurse navigator should you have any questions/concerns: Rockwell Alexandria, Cardiac Imaging Nurse Navigator Larey Brick, Cardiac Imaging Nurse Navigator  Heart and Vascular Services Direct Office Dial: [phone]   For scheduling needs, including cancellations and rescheduling, please call [place], [phone].  Cardiac CT Angiogram A cardiac CT angiogram is a procedure to look at the heart and the area around the heart. It may be done to help find the cause of chest pains or other symptoms of heart disease. During this procedure, a substance called contrast dye is injected into the blood vessels in the area to be checked. A large X-ray machine, called a CT scanner, then takes detailed pictures of the heart and the surrounding area. The procedure is also sometimes called a coronary CT angiogram, coronary  artery scanning, or CTA. A cardiac CT angiogram allows the health care provider to see how well blood is flowing to and from the heart. The health care provider will be able to see if there are any problems, such as: Blockage or narrowing of the coronary arteries in the heart. Fluid around the heart. Signs of weakness or disease in the muscles, valves, and tissues of the heart. Tell a health care provider about: Any allergies you have. This is especially important if you have had a previous allergic reaction to contrast dye. All medicines you are taking, including vitamins, herbs, eye drops, creams, and over-the-counter medicines. Any blood disorders you have. Any surgeries you have had. Any medical conditions you have. Whether you are pregnant or may be pregnant. Any anxiety disorders, chronic pain, or other conditions you have that may increase your stress or prevent you from lying still. What are the risks? Generally, this is a safe procedure. However, problems may occur, including: Bleeding. Infection. Allergic reactions to medicines or dyes. Damage to other structures or organs. Kidney damage from the contrast dye that is used. Increased risk of cancer from radiation exposure. This risk is low. Talk with your health care provider about: The risks and benefits of testing. How you can receive  the lowest dose of radiation. What happens before the procedure? Wear comfortable clothing and remove any jewelry, glasses, dentures, and hearing aids. Follow instructions from your health care provider about eating and drinking. This may include: For 12 hours before the procedure -- avoid caffeine. This includes tea, coffee, soda, energy drinks, and diet pills. Drink plenty of water or other fluids that do not have caffeine in them. Being well hydrated can prevent complications. For 4-6 hours before the procedure -- stop eating and drinking. The contrast dye can cause nausea, but this is less  likely if your stomach is empty. Ask your health care provider about changing or stopping your regular medicines. This is especially important if you are taking diabetes medicines, blood thinners, or medicines to treat problems with erections (erectile dysfunction). What happens during the procedure?  Hair on your chest may need to be removed so that small sticky patches called electrodes can be placed on your chest. These will transmit information that helps to monitor your heart during the procedure. An IV will be inserted into one of your veins. You might be given a medicine to control your heart rate during the procedure. This will help to ensure that good images are obtained. You will be asked to lie on an exam table. This table will slide in and out of the CT machine during the procedure. Contrast dye will be injected into the IV. You might feel warm, or you may get a metallic taste in your mouth. You will be given a medicine called nitroglycerin. This will relax or dilate the arteries in your heart. The table that you are lying on will move into the CT machine tunnel for the scan. The person running the machine will give you instructions while the scans are being done. You may be asked to: Keep your arms above your head. Hold your breath. Stay very still, even if the table is moving. When the scanning is complete, you will be moved out of the machine. The IV will be removed. The procedure may vary among health care providers and hospitals. What can I expect after the procedure? After your procedure, it is common to have: A metallic taste in your mouth from the contrast dye. A feeling of warmth. A headache from the nitroglycerin. Follow these instructions at home: Take over-the-counter and prescription medicines only as told by your health care provider. If you are told, drink enough fluid to keep your urine pale yellow. This will help to flush the contrast dye out of your body. Most  people can return to their normal activities right after the procedure. Ask your health care provider what activities are safe for you. It is up to you to get the results of your procedure. Ask your health care provider, or the department that is doing the procedure, when your results will be ready. Keep all follow-up visits as told by your health care provider. This is important. Contact a health care provider if: You have any symptoms of allergy to the contrast dye. These include: Shortness of breath. Rash or hives. A racing heartbeat. Summary A cardiac CT angiogram is a procedure to look at the heart and the area around the heart. It may be done to help find the cause of chest pains or other symptoms of heart disease. During this procedure, a large X-ray machine, called a CT scanner, takes detailed pictures of the heart and the surrounding area after a contrast dye has been injected into blood vessels in  the area. Ask your health care provider about changing or stopping your regular medicines before the procedure. This is especially important if you are taking diabetes medicines, blood thinners, or medicines to treat erectile dysfunction. If you are told, drink enough fluid to keep your urine pale yellow. This will help to flush the contrast dye out of your body. This information is not intended to replace advice given to you by your health care provider. Make sure you discuss any questions you have with your health care provider. Document Revised: 07/04/2021 Document Reviewed: 06/16/2019 Elsevier Patient Education  2023 [email or web address]

## 2022-04-19 NOTE — Progress Notes (Addendum)
Cardiology Office Note:    Date:  04/19/2022   ID:  Larwance Sachs, DOB 1981-06-16, MRN [id number]  PCP:  Venia Minks, NP  Cardiologist:  None  Nephrologist:  Referring MD: Venia Minks, NP   CC: Hypertension  History of Present Illness:    Stephanie Hays is a 41 y.o. female with a hx of hypertension and endometriosis here for follow-up. Was first seen on 01/22/2021 to establish care.  She was first diagnosed with hypertension during pregnancy.  She developed pre-eclampsia and her BP never returned to baseline.  She saw to cardiologist and nephrologist.  She had an abdominal CT-A 10/2017 that revealed no adenomas and patent renal arteries.  She also had renal artery Dopplers that were negative around the same time.  Thyroid function was normal 03/2020.  She had a sleep study in 2018 that was positive.  She was on a CPAP for years but lost weight and had a repeat study that was negative for OSA.  When pregnant she was on labetalol.  She subsequently on losartan/valsartan which were discontinued due to recalls and tapered once she lost weight.  She did not tolerate amlodipine and her blood pressure was high when she was switched from HCTZ to spironolactone.    She had a very healthy lifestyle. We stopped potassium and added spironolactone. She followed up with our pharmacist and her spironolactone dose was increased due to low potassium however she felt dizzy so it was reduced back to 25 mg. At follow-up, her sugars were better controlled.   At her last appointment, she was doing well with at home blood pressures of 117-122 systolic. She noted intermittent, sharp left chest pain and rare palpitations. She was found to be negative for hyperaldosteronism. She was graduated from the hypertension clinic. She messaged the office and reported intermittent chest pains during exercise and at rest. Today, she reports new chest pains that started about a week ago during a zumba class. She then went on a  walk, and jogged for about 4 minutes before taking a break. When she restarted jogging her chest pains recurred, so she stopped and walked. Later, she began experiencing this chest pain even outside of her exercise, which prompted her to seek a follow-up appointment. Typically her chest pain lasts for about 2 minutes on her right side, with no associated symptoms. Her pain is localized to different areas of her right side, including her shoulder and inferior to her right breast, and sometimes radiating to her back. She describes the pain as both a pressure and sharp quality, "like someone jabs you in the chest." At first she thought her chest pain may be related to stress. Aside from more stress at work, she denies any significant life changes. She denies any palpitations, shortness of breath, or peripheral edema. No lightheadedness, headaches, syncope, orthopnea, or PND.   Previous antihypertensives: Valsartan- recall Losartan- recall, not as effective Labetalol Amlodipine-headaches   Past Medical History:  Diagnosis Date   Atypical chest pain 12/19/2021   Endometriosis    Essential hypertension 01/22/2021   Headache    Hypertension    takes HCTZ and Valsartan daily   Hypokalemia 01/22/2021   Nocturia    Obesity (BMI 30.0-34.9) 12/19/2021   PONV (postoperative nausea and vomiting)    "only happened because they had to give me extra anesthesia wmy last c-section; I had pre-eclampsia"    Past Surgical History:  Procedure Laterality Date   ABDOMINAL WALL DEFECT REPAIR N/A 10/23/2015  Procedure: EXCISION ABDOMINAL WALL MASS;  Surgeon: Jimmye Norman, MD;  Location: [facility] OR;  Service: General;  Laterality: N/A;   CESAREAN SECTION  2001; 2006; 2010; 2013   CESAREAN SECTION  05/02/2012   Procedure: CESAREAN SECTION;  Surgeon: Serita Kyle, MD;  Location: [facility] ORS;  Service: Gynecology;  Laterality: N/A;  Repeat cesarean section with delivery of baby boy at 95. Apgars 9/10.   EXCISION MASS  ABDOMINAL  10/23/2015   MASS EXCISION  06/04/2004   of subcutaneous tissue of the abdominal wall/notes 03/19/2011    Current Medications: Current Meds  Medication Sig   chlorthalidone (HYGROTON) 25 MG tablet TAKE 1 TABLET DAILY   potassium chloride SA (KLOR-CON) 20 MEQ tablet Take 2 tablets (40 mEq total) by mouth daily.   spironolactone (ALDACTONE) 25 MG tablet TAKE 1 TABLET DAILY   [DISCONTINUED] metoprolol tartrate (LOPRESSOR) 100 MG tablet TAKE 1 TABLET 2 HOURS PRIOR TO CT     Allergies:   Verapamil and Amlodipine   Social History   Socioeconomic History   Marital status: Married    Spouse name: Not on file   Number of children: Not on file   Years of education: Not on file   Highest education level: Not on file  Occupational History   Not on file  Tobacco Use   Smoking status: Never   Smokeless tobacco: Never  Substance and Sexual Activity   Alcohol use: No   Drug use: No   Sexual activity: Yes    Birth control/protection: None  Other Topics Concern   Not on file  Social History Narrative   Not on file   Social Determinants of Health   Financial Resource Strain: Low Risk  (01/22/2021)   Overall Financial Resource Strain (CARDIA)    Difficulty of Paying Living Expenses: Not hard at all  Food Insecurity: No Food Insecurity (01/22/2021)   Hunger Vital Sign    Worried About Running Out of Food in the Last Year: Never true    Ran Out of Food in the Last Year: Never true  Transportation Needs: No Transportation Needs (01/22/2021)   PRAPARE - Administrator, Civil Service (Medical): No    Lack of Transportation (Non-Medical): No  Physical Activity: Sufficiently Active (01/22/2021)   Exercise Vital Sign    Days of Exercise per Week: 5 days    Minutes of Exercise per Session: 60 min  Stress: No Stress Concern Present (01/22/2021)   [employer] of Occupational Health - Occupational Stress Questionnaire    Feeling of Stress : Not at all  Social  Connections: Not on file     Family History: The patient's family history includes Heart attack in her maternal grandmother; Hypertension in her brother, maternal aunt, maternal uncle, and mother; Stroke in her maternal uncle.  ROS:   Please see the history of present illness.    (+) Chest pain (+) Stress All other systems reviewed and are negative.  EKGs/Labs/Other Studies Reviewed:    Bilateral Renal Artery Doppler  07/23/2017 ([facility]): IMPRESSION:  1. No evidence for significant renal artery stenosis.  2. Incidentally found complex fluid collection anterior to the uterus measuring approximate 8.1 x 2.0 x 9.8 cm.   Bilateral Carotid Doppler  06/20/2017  ([facility]): IMPRESSION:  1.  No hemodynamically significant stenosis on either side.    2.  Both vertebral arteries are patent with antegrade flow.    EKG:   EKG is personally reviewed. 04/19/2022: Sinus rhythm. Rate 72 bpm. 12/19/2021: sinus  rhythm rate- 60 bpm 01/22/2021:  sinus bradycardia.  Rate 55 bpm.  Recent Labs: 05/22/2021: BUN 20; Creatinine, Ser 0.99; Potassium 3.9; Sodium 138   Recent Lipid Panel No results found for: "CHOL", "TRIG", "HDL", "CHOLHDL", "VLDL", "LDLCALC", "LDLDIRECT"  Physical Exam:   VS:  BP 128/72 (BP Location: Left Arm, Patient Position: Sitting, Cuff Size: Normal)   Pulse 72   Ht 5\' 4"  (1.626 m)   Wt 196 lb 12.8 oz (89.3 kg)   BMI 33.78 kg/m  , BMI Body mass index is 33.78 kg/m. GENERAL:  Well appearing HEENT: Pupils equal round and reactive, fundi not visualized, oral mucosa unremarkable NECK:  No jugular venous distention, waveform within normal limits, carotid upstroke brisk and symmetric, no bruits LUNGS:  Clear to auscultation bilaterally HEART:  RRR.  PMI not displaced or sustained,S1 and S2 within normal limits, no S3, no S4, no clicks, no rubs, 2/6 systolic murmur at the LUSB ABD:  Flat, positive bowel sounds normal in frequency in pitch, no bruits, no rebound, no guarding, no midline  pulsatile mass, no hepatomegaly, no splenomegaly EXT:  2 plus pulses throughout, no edema, no cyanosis no clubbing SKIN:  No rashes no nodules NEURO:  Cranial nerves II through XII grossly intact, motor grossly intact throughout PSYCH:  Cognitively intact, oriented to person place and time   ASSESSMENT:    1. Murmur   2. Chest pain of uncertain etiology   3. Essential hypertension      PLAN:    Chest pain of uncertain etiology She has been experiencing exertional chest pain with exertion.  She also reports severe pain that radiates from her chest to her back.  We have asked her echocardiographer to take some pictures today to make sure she is not having an aortic aneurysm dissection.  This is especially concerning given that I do hear a murmur on exam that I do not remember in the past.  We will get a coronary CTA to assess her coronaries.  She will come back for fasting lipids and a CMP.  Essential hypertension Blood pressure is very well controlled on her regimen of spironolactone and chlorthalidone.  No changes at this time.   Disposition:    FU with APP in 1-2 months.   Medication Adjustments/Labs and Tests Ordered: Current medicines are reviewed at length with the patient today.  Concerns regarding medicines are outlined above.   Orders Placed This Encounter  Procedures   CT CORONARY MORPH W/CTA COR W/SCORE W/CA W/CM &/OR WO/CM   Basic metabolic panel   EKG 12-Lead   ECHOCARDIOGRAM COMPLETE   Meds ordered this encounter  Medications   DISCONTD: metoprolol tartrate (LOPRESSOR) 100 MG tablet    Sig: TAKE 1 TABLET 2 HOURS PRIOR TO CT    Dispense:  1 tablet    Refill:  0   DISCONTD: metoprolol tartrate (LOPRESSOR) 25 MG tablet    Sig: TAKE 1 TABLET 2 HOURS PRIOR TO CT    Dispense:  1 tablet    Refill:  1    D/C 100 MG RX   metoprolol tartrate (LOPRESSOR) 25 MG tablet    Sig: TAKE 1 TABLET 2 HOURS PRIOR TO CT    Dispense:  1 tablet    Refill:  1   I,Mathew  Stumpf,acting as a scribe for , MD.,have documented all relevant documentation on the behalf of Chilton Si, MD,as directed by  Chilton Si, MD while in the presence of Chilton Si, MD.  I, Tachina Spoonemore C.  Duke Salvia, MD have reviewed all documentation for this visit.  The documentation of the exam, diagnosis, procedures, and orders on 04/19/2022 are all accurate and complete.  Signed, Chilton Si, MD  04/19/2022 5:56 PM    [facility]

## 2022-04-19 NOTE — Assessment & Plan Note (Addendum)
She has been experiencing exertional chest pain with exertion.  She also reports severe pain that radiates from her chest to her back.  We have asked her echocardiographer to take some pictures today to make sure she is not having an aortic aneurysm dissection.  This is especially concerning given that I do hear a murmur on exam that I do not remember in the past.  We will get a coronary CTA to assess her coronaries.  She will come back for fasting lipids and a CMP.

## 2022-05-11 LAB — BASIC METABOLIC PANEL
BUN/Creatinine Ratio: 19 (ref 9–23)
BUN: 18 mg/dL (ref 6–24)
CO2: 25 mmol/L (ref 20–29)
Calcium: 9.5 mg/dL (ref 8.7–10.2)
Chloride: 99 mmol/L (ref 96–106)
Creatinine, Ser: 0.94 mg/dL (ref 0.57–1.00)
Glucose: 94 mg/dL (ref 70–99)
Potassium: 3.5 mmol/L (ref 3.5–5.2)
Sodium: 138 mmol/L (ref 134–144)
eGFR: 78 mL/min/{1.73_m2} (ref >59)

## 2022-05-20 ENCOUNTER — Telehealth (HOSPITAL_COMMUNITY): Payer: Self-pay | Admitting: *Deleted

## 2022-05-20 NOTE — Telephone Encounter (Signed)
Reaching out to patient to offer assistance regarding upcoming cardiac imaging study; pt verbalizes understanding of appt date/time, parking situation and where to check in, pre-test NPO status and medications ordered, and verified current allergies; name and call back number provided for further questions should they arise  Larey Brick RN Navigator Cardiac Imaging [facility] [phone] office [phone] cell  Patient to take 25mg  metoprolol tartrate two hours prior to her cardiac CT scan.  She is aware to arrive at 9am.

## 2022-05-21 ENCOUNTER — Ambulatory Visit (HOSPITAL_COMMUNITY)
Admission: RE | Admit: 2022-05-21 | Discharge: 2022-05-21 | Disposition: A | Discharge status: Home / Self Care | Payer: 59 | Source: Ambulatory Visit | Attending: Cardiovascular Disease | Admitting: Cardiovascular Disease

## 2022-05-21 DIAGNOSIS — R079 Chest pain, unspecified: Secondary | ICD-10-CM | POA: Diagnosis present

## 2022-05-21 MED ORDER — NITROGLYCERIN 0.4 MG SL SUBL
0.8000 mg | SUBLINGUAL_TABLET | Freq: Once | SUBLINGUAL | Status: AC
  Administered 2022-05-21: 0.8 mg via SUBLINGUAL

## 2022-05-21 MED ORDER — NITROGLYCERIN 0.4 MG SL SUBL
SUBLINGUAL_TABLET | SUBLINGUAL | Status: AC
  Filled 2022-05-21: qty 2

## 2022-05-21 MED ORDER — IOHEXOL 350 MG/ML SOLN
100.0000 mL | Freq: Once | INTRAVENOUS | Status: AC | PRN
  Administered 2022-05-21: 100 mL via INTRAVENOUS

## 2022-05-28 ENCOUNTER — Encounter (HOSPITAL_BASED_OUTPATIENT_CLINIC_OR_DEPARTMENT_OTHER): Payer: Self-pay

## 2022-06-04 ENCOUNTER — Encounter (HOSPITAL_BASED_OUTPATIENT_CLINIC_OR_DEPARTMENT_OTHER): Payer: Self-pay | Admitting: Family

## 2022-06-04 ENCOUNTER — Ambulatory Visit (INDEPENDENT_AMBULATORY_CARE_PROVIDER_SITE_OTHER): Payer: 59 | Admitting: Family

## 2022-06-04 VITALS — BP 112/74 | HR 64 | Ht 64.0 in | Wt 201.7 lb

## 2022-06-04 DIAGNOSIS — I10 Essential (primary) hypertension: Secondary | ICD-10-CM

## 2022-06-04 MED ORDER — POTASSIUM CHLORIDE CRYS ER 20 MEQ PO TBCR: 40.0000 meq | EXTENDED_RELEASE_TABLET | Freq: Every day | ORAL | 3 refills | Status: DC

## 2022-06-04 NOTE — Patient Instructions (Signed)
Medication Instructions:  Your Physician recommend you continue on your current medication as directed.    *If you need a refill on your cardiac medications before your next appointment, please call your pharmacy*  Follow-Up: At [facility], you and your health needs are our priority.  As part of our continuing mission to provide you with exceptional heart care, we have created designated Provider Care Teams.  These Care Teams include your primary Cardiologist (physician) and Advanced Practice Providers (APPs -  Physician Assistants and Nurse Practitioners) who all work together to provide you with the care you need, when you need it.  We recommend signing up for the patient portal called "MyChart".  Sign up information is provided on this After Visit Summary.  MyChart is used to connect with patients for Virtual Visits (Telemedicine).  Patients are able to view lab/test results, encounter notes, upcoming appointments, etc.  Non-urgent messages can be sent to your provider as well.   To learn more about what you can do with MyChart, go to [email or web address].com.    Your next appointment:   6 month(s)  The format for your next appointment:   In Person  Provider:   Tiffany [place], MD or Caitlin Walker, NP{  Other Instructions Heart Healthy Diet Recommendations: A low-salt diet is recommended. Meats should be grilled, baked, or boiled. Avoid fried foods. Focus on lean protein sources like fish or chicken with vegetables and fruits. The American Heart Association is a GREAT resource!  American Heart Association Diet and Lifeystyle Recommendations   Exercise recommendations: The American Heart Association recommends 150 minutes of moderate intensity exercise weekly. Try 30 minutes of moderate intensity exercise 4-5 times per week. This could include walking, jogging, or swimming.   Important Information About Sugar       

## 2022-06-04 NOTE — Progress Notes (Signed)
Office Visit    Patient Name: Stephanie Hays Date of Encounter: 06/04/2022  PCP:  Venia Minks, NP   [facility]  Cardiologist:  Chilton Si, MD  Advanced Practice Provider:  No care team member to display Electrophysiologist:  None      Chief Complaint    Stephanie Hays is a 41 y.o. female presents today for follow-up after coronary calcium score  Past Medical History    Past Medical History:  Diagnosis Date   Atypical chest pain 12/19/2021   Endometriosis    Essential hypertension 01/22/2021   Headache    Hypertension    takes HCTZ and Valsartan daily   Hypokalemia 01/22/2021   Nocturia    Obesity (BMI 30.0-34.9) 12/19/2021   PONV (postoperative nausea and vomiting)    "only happened because they had to give me extra anesthesia wmy last c-section; I had pre-eclampsia"   Past Surgical History:  Procedure Laterality Date   ABDOMINAL WALL DEFECT REPAIR N/A 10/23/2015   Procedure: EXCISION ABDOMINAL WALL MASS;  Surgeon: Jimmye Norman, MD;  Location: [facility];  Service: General;  Laterality: N/A;   CESAREAN SECTION  2001; 2006; 2010; 2013   CESAREAN SECTION  05/02/2012   Procedure: CESAREAN SECTION;  Surgeon: Serita Kyle, MD;  Location: [facility];  Service: Gynecology;  Laterality: N/A;  Repeat cesarean section with delivery of baby boy at 21. Apgars 9/10.   EXCISION MASS ABDOMINAL  10/23/2015   MASS EXCISION  06/04/2004   of subcutaneous tissue of the abdominal wall/notes 03/19/2011    Allergies  Allergies  Allergen Reactions   Verapamil     Other reaction(s): Dizziness (intolerance) Also caused facial numbness Also caused facial numbness    Amlodipine Other (See Comments)    Other reaction(s): Lethargy (intolerance)    History of Present Illness    Stephanie Hays is a 41 y.o. female with a hx of HTN, endometriosis last seen 04/19/22.  Last seen 04/09/22 noting chest discomfort. Subsequent coronary CTA with calcium score of 0  She  presents today for follow-up.  Notes occasional sensation of chest pain that happens at different spots on her chest and occurs predominantly at rest.  She has resumed exercise without exertional chest discomfort.  We reviewed cardiac CTA and she is reassured by the result.  Blood pressure has been well controlled at home.  Plans to continue to gradually increase her activity to goal of 150 minutes/week.  EKGs/Labs/Other Studies Reviewed:   The following studies were reviewed today:  Coronary CTA 05/2022 IMPRESSION: 1.  Calcium score 0   2.  Normal right dominant coronary arteries   3.  Normal ascending thoracic aorta 2.9 cm   Echo 04/2022  1. Left ventricular ejection fraction, by estimation, is 60 to 65%. Left  ventricular ejection fraction by 3D volume is 63 %. The left ventricle has  normal function. The left ventricle has no regional wall motion  abnormalities. Left ventricular diastolic   parameters were normal.   2. Right ventricular systolic function is normal. The right ventricular  size is normal. There is normal pulmonary artery systolic pressure.   3. The mitral valve is normal in structure. Trivial mitral valve  regurgitation. No evidence of mitral stenosis.   4. The aortic valve is tricuspid. Aortic valve regurgitation is not  visualized. No aortic stenosis is present.   5. The inferior vena cava is normal in size with greater than 50%  respiratory variability, suggesting right atrial  pressure of 3 mmHg.   Bilateral Renal Artery Doppler  07/23/2017 ([facility]): IMPRESSION:  1. No evidence for significant renal artery stenosis.  2. Incidentally found complex fluid collection anterior to the uterus measuring approximate 8.1 x 2.0 x 9.8 cm.    Bilateral Carotid Doppler  06/20/2017  ([facility]): IMPRESSION:  1.  No hemodynamically significant stenosis on either side.    2.  Both vertebral arteries are patent with antegrade flow.   EKG: No EKG today  Recent Labs: 05/10/2022:  BUN 18; Creatinine, Ser 0.94; Potassium 3.5; Sodium 138  Recent Lipid Panel No results found for: "CHOL", "TRIG", "HDL", "CHOLHDL", "VLDL", "LDLCALC", "LDLDIRECT"  Home Medications   Current Meds  Medication Sig   chlorthalidone (HYGROTON) 25 MG tablet TAKE 1 TABLET DAILY   potassium chloride SA (KLOR-CON) 20 MEQ tablet Take 2 tablets (40 mEq total) by mouth daily.   spironolactone (ALDACTONE) 25 MG tablet TAKE 1 TABLET DAILY     Review of Systems     All other systems reviewed and are otherwise negative except as noted above.  Physical Exam    VS:  BP 112/74 (BP Location: Right Arm, Patient Position: Sitting, Cuff Size: Large)   Pulse 64   Ht 5\' 4"  (1.626 m)   Wt 201 lb 11.2 oz (91.5 kg)   SpO2 100%   BMI 34.62 kg/m  , BMI Body mass index is 34.62 kg/m.  Wt Readings from Last 3 Encounters:  06/04/22 201 lb 11.2 oz (91.5 kg)  04/19/22 196 lb 12.8 oz (89.3 kg)  12/19/21 188 lb 12.8 oz (85.6 kg)     GEN: Well nourished, well developed, in no acute distress. HEENT: normal. Neck: Supple, no JVD, carotid bruits, or masses. Cardiac: RRR, no murmurs, rubs, or gallops. No clubbing, cyanosis, edema.  Radials/PT 2+ and equal bilaterally.  Respiratory:  Respirations regular and unlabored, clear to auscultation bilaterally. GI: Soft, nontender, nondistended. MS: No deformity or atrophy. Skin: Warm and dry, no rash. Neuro:  Strength and sensation are intact. Psych: Normal affect.  Assessment & Plan    HTN - BP well controlled. Continue current antihypertensive regimen.  Heart healthy diet and regular cardiovascular exercise encouraged.    Atypical chest pain -cardiac CTA with coronary calcium score of 0.  Chest pain atypical for angina as occurs at rest.  She is reassured by cardiac CTA. Consider etiology muscle spasm versus stress.  BMP 05/10/2022 with normal potassium.  We discussed possibly checking magnesium but the symptoms are overall not bothersome she prefers to  defer.  Disposition: Follow up in 6 month(s) with 07/11/2022, MD or APP.  Signed, Chilton Si, NP 06/04/2022, 10:20 AM [facility]

## 2022-12-19 ENCOUNTER — Other Ambulatory Visit (HOSPITAL_BASED_OUTPATIENT_CLINIC_OR_DEPARTMENT_OTHER): Payer: Self-pay | Admitting: Cardiovascular Disease

## 2022-12-19 NOTE — Telephone Encounter (Signed)
Rx request sent to pharmacy.  

## 2023-01-16 ENCOUNTER — Other Ambulatory Visit (INDEPENDENT_AMBULATORY_CARE_PROVIDER_SITE_OTHER): Payer: 59

## 2023-01-16 ENCOUNTER — Ambulatory Visit (INDEPENDENT_AMBULATORY_CARE_PROVIDER_SITE_OTHER): Payer: 59 | Admitting: Family

## 2023-01-16 ENCOUNTER — Encounter (HOSPITAL_BASED_OUTPATIENT_CLINIC_OR_DEPARTMENT_OTHER): Payer: Self-pay | Admitting: Family

## 2023-01-16 VITALS — BP 126/88 | HR 61 | Ht 64.0 in | Wt 232.0 lb

## 2023-01-16 DIAGNOSIS — R002 Palpitations: Secondary | ICD-10-CM

## 2023-01-16 DIAGNOSIS — I10 Essential (primary) hypertension: Secondary | ICD-10-CM

## 2023-01-16 NOTE — Progress Notes (Signed)
Office Visit    Patient Name: Stephanie Hays Date of Encounter: 01/16/2023  PCP:  Zara Chess, NP   [facility]  Cardiologist:  Skeet Latch, MD  Advanced Practice Provider:  No care team member to display Electrophysiologist:  None      Chief Complaint    Stephanie Hays is a 42 y.o. female presents today for palpitations  Past Medical History    Past Medical History:  Diagnosis Date   Atypical chest pain 12/19/2021   Endometriosis    Essential hypertension 01/22/2021   Headache    Hypertension    takes HCTZ and Valsartan daily   Hypokalemia 01/22/2021   Nocturia    Obesity (BMI 30.0-34.9) 12/19/2021   PONV (postoperative nausea and vomiting)    "only happened because they had to give me extra anesthesia wmy last c-section; I had pre-eclampsia"   Past Surgical History:  Procedure Laterality Date   ABDOMINAL WALL DEFECT REPAIR N/A 10/23/2015   Procedure: EXCISION ABDOMINAL WALL MASS;  Surgeon: Judeth Horn, MD;  Location: [facility] OR;  Service: General;  Laterality: N/A;   CESAREAN SECTION  2001; 2006; 2010; 2013   CESAREAN SECTION  05/02/2012   Procedure: CESAREAN SECTION;  Surgeon: Marvene Staff, MD;  Location: [facility];  Service: Gynecology;  Laterality: N/A;  Repeat cesarean section with delivery of baby boy at 24. Apgars 9/10.   EXCISION MASS ABDOMINAL  10/23/2015   MASS EXCISION  06/04/2004   of subcutaneous tissue of the abdominal wall/notes 03/19/2011    Allergies  Allergies  Allergen Reactions   Verapamil     Other reaction(s): Dizziness (intolerance) Also caused facial numbness Also caused facial numbness    Amlodipine Other (See Comments)    Other reaction(s): Lethargy (intolerance)    History of Present Illness    Stephanie Hays is a 42 y.o. female with a hx of HTN, endometriosis last seen 06/04/22.  Last seen 04/09/22 noting chest discomfort. Subsequent coronary CTA with calcium score of 0. She was seen in follow up 06/04/22  noting occasional sensation of chest pain that happens at different spots on her chest predominantly at rest. Reassurance provided and she was encouraged to gradually increase her activity to goal of 150 minutes/week.  She presents today with palpitations. Tells me she has had palpitations previously but over the last 2 weeks has been having daily palpitations which is more frequent. Describes palpitations as a "jump" happening at rest or with activity and lasts no more than a few minutes. Does note it makes her feel anxious but no associated chest pain nor dyspnea.   Reports no shortness of breath nor dyspnea on exertion. Reports no chest pain, pressure, or tightness. No edema, orthopnea, PND.   EKGs/Labs/Other Studies Reviewed:   The following studies were reviewed today:  Coronary CTA 05/2022 IMPRESSION: 1.  Calcium score 0   2.  Normal right dominant coronary arteries   3.  Normal ascending thoracic aorta 2.9 cm   Echo 04/2022  1. Left ventricular ejection fraction, by estimation, is 60 to 65%. Left  ventricular ejection fraction by 3D volume is 63 %. The left ventricle has  normal function. The left ventricle has no regional wall motion  abnormalities. Left ventricular diastolic   parameters were normal.   2. Right ventricular systolic function is normal. The right ventricular  size is normal. There is normal pulmonary artery systolic pressure.   3. The mitral valve is normal in structure. Trivial  mitral valve  regurgitation. No evidence of mitral stenosis.   4. The aortic valve is tricuspid. Aortic valve regurgitation is not  visualized. No aortic stenosis is present.   5. The inferior vena cava is normal in size with greater than 50%  respiratory variability, suggesting right atrial pressure of 3 mmHg.   Bilateral Renal Artery Doppler  07/23/2017 ([facility]): IMPRESSION:  1. No evidence for significant renal artery stenosis.  2. Incidentally found complex fluid collection  anterior to the uterus measuring approximate 8.1 x 2.0 x 9.8 cm.    Bilateral Carotid Doppler  06/20/2017  ([facility]): IMPRESSION:  1.  No hemodynamically significant stenosis on either side.    2.  Both vertebral arteries are patent with antegrade flow.   EKG: EKG ordered today. EKG performed today demonstrates NSR 61 bpm with no acute ST/T wave changes.   Recent Labs: 05/10/2022: BUN 18; Creatinine, Ser 0.94; Potassium 3.5; Sodium 138  Recent Lipid Panel No results found for: "CHOL", "TRIG", "HDL", "CHOLHDL", "VLDL", "LDLCALC", "LDLDIRECT"  Home Medications   Current Meds  Medication Sig   chlorthalidone (HYGROTON) 25 MG tablet TAKE 1 TABLET DAILY   potassium chloride SA (KLOR-CON M) 20 MEQ tablet Take 2 tablets (40 mEq total) by mouth daily.   spironolactone (ALDACTONE) 25 MG tablet TAKE 1 TABLET DAILY     Review of Systems     All other systems reviewed and are otherwise negative except as noted above.  Physical Exam    VS:  BP 126/88   Pulse 61   Ht '5\' 4"'$  (1.626 m)   Wt 232 lb (105.2 kg)   BMI 39.82 kg/m  , BMI Body mass index is 39.82 kg/m.  Wt Readings from Last 3 Encounters:  01/16/23 232 lb (105.2 kg)  06/04/22 201 lb 11.2 oz (91.5 kg)  04/19/22 196 lb 12.8 oz (89.3 kg)     GEN: Well nourished, well developed, in no acute distress. HEENT: normal. Neck: Supple, no JVD, carotid bruits, or masses. Cardiac: RRR, no murmurs, rubs, or gallops. No clubbing, cyanosis, edema.  Radials/PT 2+ and equal bilaterally.  Respiratory:  Respirations regular and unlabored, clear to auscultation bilaterally. GI: Soft, nontender, nondistended. MS: No deformity or atrophy. Skin: Warm and dry, no rash. Neuro:  Strength and sensation are intact. Psych: Normal affect.  Assessment & Plan    Palpitations - Longstanding history but 2 week history of increased severity and frequency. ZIO monitor placed in clinic. TSH, CBC, BMP, mag to assess for thyroid or electrolyte abnormality or  anemia as contributory.  She reports being well-hydrated, avoids caffeine/alcohol, no proarrhythmic agents.  HTN - BP well controlled. Continue current antihypertensive regimen.  Heart healthy diet and regular cardiovascular exercise encouraged.    Atypical chest pain -cardiac CTA 05/21/2022 with coronary calcium score of 0.  No recurrent chest pain.  No indication for further workup at this time.  Disposition: Follow up in 6 week(s) with Skeet Latch, MD or APP.  Signed, Loel Dubonnet, NP 01/16/2023, 9:14 AM [place]

## 2023-01-16 NOTE — Patient Instructions (Signed)
Medication Instructions:  Your physician recommends that you continue on your current medications as directed. Please refer to the Current Medication list given to you today.    Labwork: Your physician recommends that you return for lab work today- BMP, Mag, TSH, CBC   Testing/Procedures: Your physician has recommended that you wear a Zio monitor.   This monitor is a medical device that records the heart's electrical activity. Doctors most often use these monitors to diagnose arrhythmias. Arrhythmias are problems with the speed or rhythm of the heartbeat. The monitor is a small device applied to your chest. You can wear one while you do your normal daily activities. While wearing this monitor if you have any symptoms to push the button and record what you felt. Once you have worn this monitor for the period of time provider prescribed (Usually 14 days), you will return the monitor device in the postage paid box. Once it is returned they will download the data collected and provide [place] with a report which the provider will then review and we will call you with those results. Important tips:  Avoid showering during the first 24 hours of wearing the monitor. Avoid excessive sweating to help maximize wear time. Do not submerge the device, no hot tubs, and no swimming pools. Keep any lotions or oils away from the patch. After 24 hours you may shower with the patch on. Take brief showers with your back facing the shower head.  Do not remove patch once it has been placed because that will interrupt data and decrease adhesive wear time. Push the button when you have any symptoms and write down what you were feeling. Once you have completed wearing your monitor, remove and place into box which has postage paid and place in your outgoing mailbox.  If for some reason you have misplaced your box then call our office and we can provide another box and/or mail it off for you.   Follow-Up: 6 weeks with  Laurann Montana, NP

## 2023-01-17 LAB — CBC
Hematocrit: 40.5 % (ref 34.0–46.6)
Hemoglobin: 13.3 g/dL (ref 11.1–15.9)
MCH: 31.1 pg (ref 26.6–33.0)
MCHC: 32.8 g/dL (ref 31.5–35.7)
MCV: 95 fL (ref 79–97)
Platelets: 354 10*3/uL (ref 150–450)
RBC: 4.28 x10E6/uL (ref 3.77–5.28)
RDW: 12.1 % (ref 11.7–15.4)
WBC: 7.7 10*3/uL (ref 3.4–10.8)

## 2023-01-17 LAB — BASIC METABOLIC PANEL
BUN/Creatinine Ratio: 20 (ref 9–23)
BUN: 23 mg/dL (ref 6–24)
CO2: 23 mmol/L (ref 20–29)
Calcium: 10.4 mg/dL — ABNORMAL HIGH (ref 8.7–10.2)
Chloride: 99 mmol/L (ref 96–106)
Creatinine, Ser: 1.16 mg/dL — ABNORMAL HIGH (ref 0.57–1.00)
Glucose: 79 mg/dL (ref 70–99)
Potassium: 3.7 mmol/L (ref 3.5–5.2)
Sodium: 139 mmol/L (ref 134–144)
eGFR: 60 mL/min/{1.73_m2} (ref >59)

## 2023-01-17 LAB — MAGNESIUM: Magnesium: 1.8 mg/dL (ref 1.6–2.3)

## 2023-01-17 LAB — TSH: TSH: 1.39 u[IU]/mL (ref 0.450–4.500)

## 2023-02-14 ENCOUNTER — Encounter (HOSPITAL_BASED_OUTPATIENT_CLINIC_OR_DEPARTMENT_OTHER): Payer: Self-pay

## 2023-02-14 NOTE — Telephone Encounter (Signed)
Hi all looks like report came into Epic on 3/27, can anyone tell why it hasn't been resulted?

## 2023-02-28 ENCOUNTER — Ambulatory Visit (HOSPITAL_BASED_OUTPATIENT_CLINIC_OR_DEPARTMENT_OTHER): Payer: 59 | Admitting: Family

## 2023-04-11 ENCOUNTER — Ambulatory Visit (HOSPITAL_BASED_OUTPATIENT_CLINIC_OR_DEPARTMENT_OTHER): Payer: 59 | Admitting: Family

## 2023-05-23 ENCOUNTER — Encounter (HOSPITAL_BASED_OUTPATIENT_CLINIC_OR_DEPARTMENT_OTHER): Payer: Self-pay | Admitting: Family

## 2023-05-23 ENCOUNTER — Ambulatory Visit (INDEPENDENT_AMBULATORY_CARE_PROVIDER_SITE_OTHER): Payer: 59 | Admitting: Family

## 2023-05-23 VITALS — BP 121/81 | HR 64 | Ht 64.0 in | Wt 211.9 lb

## 2023-05-23 DIAGNOSIS — R0789 Other chest pain: Secondary | ICD-10-CM | POA: Diagnosis not present

## 2023-05-23 DIAGNOSIS — R002 Palpitations: Secondary | ICD-10-CM | POA: Diagnosis not present

## 2023-05-23 DIAGNOSIS — I10 Essential (primary) hypertension: Secondary | ICD-10-CM | POA: Diagnosis not present

## 2023-05-23 MED ORDER — CHLORTHALIDONE 25 MG PO TABS: 12.5000 mg | ORAL_TABLET | Freq: Every day | ORAL | 3 refills | Status: DC

## 2023-05-23 NOTE — Patient Instructions (Addendum)
Medication Instructions:  Your physician has recommended you make the following change in your medication:   REDUCE Chlorthalidone to 12.5mg  (half tablet) once per day.   *If you need a refill on your cardiac medications before your next appointment, please call your pharmacy*   Lab Work: Your physician recommends that you return for lab work in 7-10 days at [facility] in [place]  If you have labs (blood work) drawn today and your tests are completely normal, you will receive your results only by: [employer] (if you have MyChart) OR A paper copy in the mail If you have any lab test that is abnormal or we need to change your treatment, we will call you to review the results  Follow-Up: At [facility], you and your health needs are our priority.  As part of our continuing mission to provide you with exceptional heart care, we have created designated Provider Care Teams.  These Care Teams include your primary Cardiologist (physician) and Advanced Practice Providers (APPs -  Physician Assistants and Nurse Practitioners) who all work together to provide you with the care you need, when you need it.  We recommend signing up for the patient portal called "MyChart".  Sign up information is provided on this After Visit Summary.  MyChart is used to connect with patients for Virtual Visits (Telemedicine).  Patients are able to view lab/test results, encounter notes, upcoming appointments, etc.  Non-urgent messages can be sent to your provider as well.   To learn more about what you can do with MyChart, go to [email or web address].    Your next appointment:   6 month(s)  Provider:   Chilton Si, MD or Gillian Shields, NP    Other Instructions  To prevent palpitations: Make sure you are adequately hydrated.  Avoid and/or limit caffeine containing beverages like soda or tea. Exercise regularly.  Manage stress well. Some over the counter medications can cause palpitations  such as Benadryl, AdvilPM, TylenolPM. Regular Advil or Tylenol do not cause palpitations.

## 2023-05-23 NOTE — Progress Notes (Unsigned)
Cardiology Office Note:  .   Date:  05/29/2023  ID:  Stephanie Hays, DOB July 18, 1981, MRN [id number] PCP: Venia Minks, NP  [facility] Providers Cardiologist:  Chilton Si, MD    History of Present Illness: .   Stephanie Hays is a 42 y.o. female with a hx of HTN, endometriosis last seen 01/16/23.   Seen 04/09/22 noting chest discomfort. Subsequent coronary CTA with calcium score of 0. She was seen in follow up 06/04/22 noting occasional sensation of chest pain that happens at different spots on her chest predominantly at rest. Reassurance provided and she was encouraged to gradually increase her activity to goal of 150 minutes/week.  Last seen 01/16/23 noting increased palpitations. Subsequently monitor with NSR and rare PVC/PAC with <1% burden. Triggered episodes associated with NSR or PVC.   She presents today for follow up. Pleasant lady who enjoys spending time with her sons. Notes only occasional palpitations. Reviewed monitor results. Reports no shortness of breath nor dyspnea on exertion. Reports no chest pain, pressure, or tightness. No edema, orthopnea, PND.    ROS: Please see the history of present illness.    All other systems reviewed and are negative.   Studies Reviewed: .        Cardiac Studies & Procedures       ECHOCARDIOGRAM  ECHOCARDIOGRAM COMPLETE 04/19/2022  Narrative ECHOCARDIOGRAM REPORT    Patient Name:   Stephanie Hays Date of Exam: 04/19/2022 Medical Rec #:  [id number]     Height:       64.0 in Accession #:    [id number]    Weight:       196.8 lb Date of Birth:  [birth year]      BSA:          1.943 m Patient Age:    40 years      BP:           128/72 mmHg Patient Gender: F             HR:           62 bpm. Exam Location:  Outpatient  Procedure: 2D Echo, Cardiac Doppler and Color Doppler  Indications:    R07.2 Precordial pain  History:        Patient has no prior history of Echocardiogram examinations. Signs/Symptoms:Chest Pain; Risk  Factors:Hypertension and Non-Smoker. Patient has had chest pain x 1 week. Patient denies SOB and lwg edema.  Sonographer:    Carlos American RVT, RDCS (AE), RDMS Referring Phys: [id number] TIFFANY [place]  IMPRESSIONS   1. Left ventricular ejection fraction, by estimation, is 60 to 65%. Left ventricular ejection fraction by 3D volume is 63 %. The left ventricle has normal function. The left ventricle has no regional wall motion abnormalities. Left ventricular diastolic parameters were normal. 2. Right ventricular systolic function is normal. The right ventricular size is normal. There is normal pulmonary artery systolic pressure. 3. The mitral valve is normal in structure. Trivial mitral valve regurgitation. No evidence of mitral stenosis. 4. The aortic valve is tricuspid. Aortic valve regurgitation is not visualized. No aortic stenosis is present. 5. The inferior vena cava is normal in size with greater than 50% respiratory variability, suggesting right atrial pressure of 3 mmHg.  FINDINGS Left Ventricle: Left ventricular ejection fraction, by estimation, is 60 to 65%. Left ventricular ejection fraction by 3D volume is 63 %. The left ventricle has normal function. The left ventricle has no regional wall motion abnormalities. The left ventricular  internal cavity size was normal in size. There is no left ventricular hypertrophy. Left ventricular diastolic parameters were normal.  Right Ventricle: The right ventricular size is normal. No increase in right ventricular wall thickness. Right ventricular systolic function is normal. There is normal pulmonary artery systolic pressure. The tricuspid regurgitant velocity is 2.47 m/s, and with an assumed right atrial pressure of 3 mmHg, the estimated right ventricular systolic pressure is 27.4 mmHg.  Left Atrium: Left atrial size was normal in size.  Right Atrium: Right atrial size was normal in size.  Pericardium: There is no evidence of pericardial  effusion.  Mitral Valve: The mitral valve is normal in structure. Trivial mitral valve regurgitation. No evidence of mitral valve stenosis.  Tricuspid Valve: The tricuspid valve is normal in structure. Tricuspid valve regurgitation is trivial. No evidence of tricuspid stenosis.  Aortic Valve: The aortic valve is tricuspid. Aortic valve regurgitation is not visualized. No aortic stenosis is present. Aortic valve mean gradient measures 4.0 mmHg. Aortic valve peak gradient measures 8.9 mmHg.  Pulmonic Valve: The pulmonic valve was not well visualized. Pulmonic valve regurgitation is not visualized. No evidence of pulmonic stenosis.  Aorta: The aortic root, ascending aorta, aortic arch and descending aorta are all structurally normal, with no evidence of dilitation or obstruction.  Venous: The inferior vena cava is normal in size with greater than 50% respiratory variability, suggesting right atrial pressure of 3 mmHg.  IAS/Shunts: The atrial septum is grossly normal.   LEFT VENTRICLE PLAX 2D LVIDd:         4.26 cm         Diastology LVIDs:         2.92 cm         LV e' medial:    9.57 cm/s LV PW:         0.89 cm         LV E/e' medial:  8.4 LV IVS:        1.00 cm         LV e' lateral:   13.10 cm/s LV E/e' lateral: 6.1  LV Volumes (MOD) LV vol d, MOD    61.9 ml       3D Volume EF A2C:                           LV 3D EF:    Left LV vol d, MOD    54.7 ml                    ventricul A4C:                                        ar LV vol s, MOD    16.3 ml                    ejection A2C:                                        fraction LV vol s, MOD    10.1 ml                    by 3D A4C:  volume is LV SV MOD A2C:   45.6 ml                    63 %. LV SV MOD A4C:   54.7 ml LV SV MOD BP:    45.8 ml 3D Volume EF: 3D EF:        63 % LV EDV:       124 ml LV ESV:       45 ml LV SV:        79 ml  RIGHT VENTRICLE RV S prime:     16.10 cm/s TAPSE  (M-mode): 2.6 cm  LEFT ATRIUM             Index        RIGHT ATRIUM           Index LA diam:        4.00 cm 2.06 cm/m   RA Area:     14.10 cm LA Vol (A2C):   51.9 ml 26.71 ml/m  RA Volume:   33.10 ml  17.04 ml/m LA Vol (A4C):   54.1 ml 27.84 ml/m LA Biplane Vol: 54.5 ml 28.05 ml/m AORTIC VALVE                   PULMONIC VALVE AV Vmax:           149.00 cm/s PV Vmax:       1.12 m/s AV Vmean:          94.500 cm/s PV Peak grad:  5.0 mmHg AV VTI:            0.282 m AV Peak Grad:      8.9 mmHg AV Mean Grad:      4.0 mmHg LVOT Vmax:         109.00 cm/s LVOT Vmean:        79.500 cm/s LVOT VTI:          0.238 m LVOT/AV VTI ratio: 0.84  AORTA Ao Root diam: 3.10 cm Ao Asc diam:  2.70 cm Ao Arch diam: 2.5 cm  MITRAL VALVE               TRICUSPID VALVE MV Area (PHT): 2.62 cm    TR Peak grad:   24.4 mmHg MV Decel Time: 290 msec    TR Vmax:        247.00 cm/s MV E velocity: 80.10 cm/s MV A velocity: 57.30 cm/s  SHUNTS MV E/A ratio:  1.40        Systemic VTI: 0.24 m  Jodelle Red MD Electronically signed by Jodelle Red MD Signature Date/Time: 04/19/2022/4:45:04 PM    Final    MONITORS  LONG TERM MONITOR (3-14 DAYS) 01/30/2023  Narrative 10 Day Zio Monitor  Quality: Fair.  Baseline artifact. Predominant rhythm: sinus rhythm Average heart rate: 70 bpm Max heart rate: 178 bpm Min heart rate: 48 bpm Pauses >2.5 seconds: none  Rare (<1%) PACs and PVCs  Tiffany C. Duke Salvia, MD, [facility] 03/05/2023 1:54 PM   CT SCANS  CT CORONARY MORPH W/CTA COR W/SCORE 05/21/2022  Addendum 05/21/2022 11:01 AM ADDENDUM REPORT: 05/21/2022 10:58  EXAM: OVER-READ INTERPRETATION  CT CHEST  The following report is an over-read performed by radiologist Dr. [facility] on 05/21/2022. This over-read does not include interpretation of cardiac or coronary anatomy or pathology. The coronary CTA interpretation by the cardiologist  is attached.  COMPARISON:  None.  FINDINGS: Normal caliber thoracic  aorta.  No dissection.  No mediastinal or hilar mass or adenopathy. The visualized esophagus is grossly normal.  The lungs are clear of an acute process. No worrisome pulmonary lesions or pulmonary nodules.  No significant upper abdominal findings.  No significant bony findings.  IMPRESSION: No significant extracardiac findings.   Electronically Signed By: Rudie Meyer M.D. On: 05/21/2022 10:58  Narrative CLINICAL DATA:  Chest pain  EXAM: Cardiac CTA  MEDICATIONS: Sub lingual nitro. 4mg  and lopressor 25mg   TECHNIQUE: The patient was scanned on a Siemens Force 192 slice scanner. Gantry rotation speed was 250 msecs. Collimation was .6 mm. A 100 kV prospective scan was triggered in the ascending thoracic aorta at 140 HU's Full mA was used between 35% and 75% of the R-R interval. Average HR during the scan was 68 bpm. The 3D data set was interpreted on a dedicated work station using MPR, MIP and VRT modes. A total of 80cc of contrast was used.  FINDINGS: Non-cardiac: See separate report from [facility]. No significant findings on limited lung and soft tissue windows.  Calcium Score: No calcium noted  Coronary Arteries: Right dominant with no anomalies  LM: Normal  LAD: Normal  D1: Normal  D2: Normal  Circumflex: Normal  OM1: Normal  OM2: Normal  RCA: Normal  PDA: Normal  PLA: Normal  IMPRESSION: 1.  Calcium score 0  2.  Normal right dominant coronary arteries  3.  Normal ascending thoracic aorta 2.9 cm  Charlton Haws  Electronically Signed: By: Charlton Haws M.D. On: 05/21/2022 10:39          Risk Assessment/Calculations:            Physical Exam:   VS:  BP 121/81 (BP Location: Left Arm, Patient Position: Sitting, Cuff Size: Normal)   Pulse 64   Ht 5\' 4"  (1.626 m)   Wt 211 lb 14.4 oz (96.1 kg)   SpO2 100%   BMI 36.37 kg/m    Wt Readings from Last  3 Encounters:  05/23/23 211 lb 14.4 oz (96.1 kg)  01/16/23 232 lb (105.2 kg)  06/04/22 201 lb 11.2 oz (91.5 kg)    GEN: Well nourished, well developed in no acute distress NECK: No JVD; No carotid bruits CARDIAC: RRR, no murmurs, rubs, gallops RESPIRATORY:  Clear to auscultation without rales, wheezing or rhonchi  ABDOMEN: Soft, non-tender, non-distended EXTREMITIES:  No edema; No deformity   ASSESSMENT AND PLAN: .   Palpitations - ZIO 01/22/2023 with NSR and PVC burden <1%. Triggered episodes NSR or PVC. Previous TSH, CBC, BMP, mag unremarkable.  She reports being well-hydrated, avoids caffeine/alcohol, no proarrhythmic agents. Will reduce Chlorthalidone as BP controlled to prevent dehydration contributing to palpitations. Discussed PRN BB but she prefers to monitor symptoms for now.    HTN - BP well controlled. Reduce Chlorthalidone to 12.5mg  daily. If BP still at goal <130/80, consider further reducing for simplification of regimen.  BMP in a week to reassess potassium level.  Continue spironolactone 25 mg daily.  Atypical chest pain -cardiac CTA 05/21/2022 with coronary calcium score of 0.  No recurrent chest pain.  No indication for further workup at this time.       Dispo: follow up in 6 months  Signed, Alver Sorrow, NP

## 2023-05-29 ENCOUNTER — Encounter (HOSPITAL_BASED_OUTPATIENT_CLINIC_OR_DEPARTMENT_OTHER): Payer: Self-pay | Admitting: Family

## 2023-06-17 ENCOUNTER — Other Ambulatory Visit (HOSPITAL_BASED_OUTPATIENT_CLINIC_OR_DEPARTMENT_OTHER): Payer: Self-pay | Admitting: Cardiovascular Disease

## 2023-06-17 NOTE — Telephone Encounter (Signed)
Rx request sent to pharmacy.  

## 2023-06-18 ENCOUNTER — Telehealth (HOSPITAL_BASED_OUTPATIENT_CLINIC_OR_DEPARTMENT_OTHER): Payer: Self-pay | Admitting: *Deleted

## 2023-06-18 MED ORDER — CHLORTHALIDONE 25 MG PO TABS: 12.5000 mg | ORAL_TABLET | Freq: Every day | ORAL | 3 refills | Status: DC

## 2023-06-18 NOTE — Telephone Encounter (Signed)
Rx(s) sent to pharmacy electronically.  

## 2023-06-24 ENCOUNTER — Other Ambulatory Visit (HOSPITAL_BASED_OUTPATIENT_CLINIC_OR_DEPARTMENT_OTHER): Payer: Self-pay | Admitting: Family

## 2023-06-24 NOTE — Telephone Encounter (Signed)
Okay to send 30 day supply. Recommend we call her to remind her to have BMP collected.   Alver Sorrow, NP

## 2023-06-24 NOTE — Telephone Encounter (Signed)
Pt has not had BMET ordered at last visit to reassess K+ level. Ok to send in refill or wait for lab work?

## 2023-06-25 NOTE — Telephone Encounter (Signed)
Called and spoke to pt. Gave recommendations from [place], NP. Pt stated she meant to send a message concerning her BP. She stated her BP started going up on the 1/2 tab of Chlorthalidone 25 mg. She has gone back to taking the full 25 mg daily and was not sure if blood work was still needed.   Informed pt that we do need to evaluate her kidney function and potassium levels and would like for her to have the blood work done in the next couple of days if possible. Pt was agreeable to this and stated she will have it done at [facility] in [place].  Informed pt I will send this to [facility] to let her know and call her back if any further recommendations. Pt verbalized thanks and understanding.

## 2023-06-26 MED ORDER — CHLORTHALIDONE 25 MG PO TABS: 25.0000 mg | ORAL_TABLET | Freq: Every day | ORAL | Status: DC

## 2023-06-26 NOTE — Telephone Encounter (Signed)
Medication list updated, Rx not sent to pharmacy until we have new lab results.   Called patient to see if she has had labs done and to get updated blood pressure readings.    She will get labs done tomorrow. She is currently at soccer game, but will either call or send my chart message with blood pressure readings.

## 2023-06-26 NOTE — Telephone Encounter (Signed)
Likely needs turned into phone encounter.   Please update med list to how she is taking meds, ensure got BMP, and obtain recent BP readings. TY!  Alver Sorrow, NP

## 2023-06-27 LAB — BASIC METABOLIC PANEL
BUN: 20 mg/dL (ref 6–24)
Potassium: 3.2 mmol/L — ABNORMAL LOW (ref 3.5–5.2)

## 2023-06-30 ENCOUNTER — Telehealth (HOSPITAL_BASED_OUTPATIENT_CLINIC_OR_DEPARTMENT_OTHER): Payer: Self-pay

## 2023-06-30 DIAGNOSIS — I10 Essential (primary) hypertension: Secondary | ICD-10-CM

## 2023-06-30 MED ORDER — CHLORTHALIDONE 25 MG PO TABS: 25.0000 mg | ORAL_TABLET | Freq: Every day | ORAL | 3 refills | Status: DC

## 2023-06-30 MED ORDER — CHLORTHALIDONE 25 MG PO TABS: 12.5000 mg | ORAL_TABLET | Freq: Every day | ORAL | 3 refills | Status: DC

## 2023-06-30 NOTE — Telephone Encounter (Signed)
Okay to stay on increased Chlorthalidone.   Potassium BId x 2 days then return to daily (takes two tabs to make dose).   Repeat BMP in 5-7 days to reassess.  Alver Sorrow, NP

## 2023-06-30 NOTE — Telephone Encounter (Signed)
Will send message about increasing K+, ok for her to stay on the increased 25mg  dose?

## 2023-06-30 NOTE — Telephone Encounter (Addendum)
Seen by patient Stephanie Hays on 06/30/2023  8:12 AM; follow up mychart message sent to patient.   ----- Message from Alver Sorrow sent at 06/30/2023  8:03 AM EDT ----- Kidney function improving.  Potassium low.  At visit 05/23/2023 she was recommended to reduce chlorthalidone to 12.5 mg daily (eMAR needs updated) and continue potassium 40 mEq and Spironolactone 25mg  daily. Please inquire how she has been taking her medications recently.   If taking as prescribed, increase potassium to BID x 2 days.

## 2023-06-30 NOTE — Addendum Note (Signed)
Addended by: Marlene Lard on: 06/30/2023 04:26 PM   Modules accepted: Orders

## 2023-09-05 ENCOUNTER — Other Ambulatory Visit (HOSPITAL_BASED_OUTPATIENT_CLINIC_OR_DEPARTMENT_OTHER): Payer: Self-pay

## 2023-09-05 ENCOUNTER — Encounter (HOSPITAL_BASED_OUTPATIENT_CLINIC_OR_DEPARTMENT_OTHER): Payer: Self-pay

## 2023-09-16 ENCOUNTER — Other Ambulatory Visit (HOSPITAL_BASED_OUTPATIENT_CLINIC_OR_DEPARTMENT_OTHER): Payer: Self-pay

## 2023-09-16 LAB — BASIC METABOLIC PANEL
BUN/Creatinine Ratio: 15 (ref 9–23)
BUN: 15 mg/dL (ref 6–24)
CO2: 26 mmol/L (ref 20–29)
Calcium: 9.4 mg/dL (ref 8.7–10.2)
Chloride: 102 mmol/L (ref 96–106)
Creatinine, Ser: 0.98 mg/dL (ref 0.57–1.00)
Glucose: 91 mg/dL (ref 70–99)
Potassium: 3.7 mmol/L (ref 3.5–5.2)
Sodium: 139 mmol/L (ref 134–144)
eGFR: 73 mL/min/{1.73_m2} (ref >59)

## 2023-09-16 MED ORDER — POTASSIUM CHLORIDE CRYS ER 20 MEQ PO TBCR: 40.0000 meq | EXTENDED_RELEASE_TABLET | Freq: Every day | ORAL | 1 refills | Status: DC

## 2023-09-16 NOTE — Telephone Encounter (Signed)
Potassium supplement (?)

## 2023-09-16 NOTE — Telephone Encounter (Signed)
Continue Potassium daily (takes two tabs to make dose).   Alver Sorrow, NP

## 2023-09-26 ENCOUNTER — Telehealth (HOSPITAL_BASED_OUTPATIENT_CLINIC_OR_DEPARTMENT_OTHER): Payer: Self-pay

## 2023-09-26 MED ORDER — POTASSIUM CHLORIDE CRYS ER 20 MEQ PO TBCR: 40.0000 meq | EXTENDED_RELEASE_TABLET | Freq: Every day | ORAL | 1 refills | Status: DC

## 2023-09-26 NOTE — Telephone Encounter (Signed)
Previous refill did not go through from previous encounter MyChart message received from patient regarding refill status. Refills sent to preferred pharmacy.

## 2023-09-30 ENCOUNTER — Telehealth (HOSPITAL_BASED_OUTPATIENT_CLINIC_OR_DEPARTMENT_OTHER): Payer: Self-pay

## 2023-09-30 MED ORDER — POTASSIUM CHLORIDE CRYS ER 20 MEQ PO TBCR: 20.0000 meq | EXTENDED_RELEASE_TABLET | Freq: Every day | ORAL | 3 refills | Status: DC

## 2023-09-30 NOTE — Telephone Encounter (Signed)
Refill has been sent to pharmacy.

## 2023-12-15 ENCOUNTER — Encounter (HOSPITAL_BASED_OUTPATIENT_CLINIC_OR_DEPARTMENT_OTHER): Payer: Self-pay

## 2023-12-15 DIAGNOSIS — I10 Essential (primary) hypertension: Secondary | ICD-10-CM

## 2023-12-15 MED ORDER — CHLORTHALIDONE 25 MG PO TABS: 25.0000 mg | ORAL_TABLET | Freq: Every day | ORAL | 3 refills | Status: DC

## 2024-02-04 ENCOUNTER — Encounter (HOSPITAL_BASED_OUTPATIENT_CLINIC_OR_DEPARTMENT_OTHER): Payer: Self-pay

## 2024-02-04 DIAGNOSIS — I10 Essential (primary) hypertension: Secondary | ICD-10-CM

## 2024-02-04 MED ORDER — CHLORTHALIDONE 25 MG PO TABS: 25.0000 mg | ORAL_TABLET | Freq: Every day | ORAL | 3 refills | Status: DC

## 2024-04-26 ENCOUNTER — Encounter (HOSPITAL_BASED_OUTPATIENT_CLINIC_OR_DEPARTMENT_OTHER): Payer: Self-pay

## 2024-04-26 DIAGNOSIS — I10 Essential (primary) hypertension: Secondary | ICD-10-CM

## 2024-04-26 MED ORDER — CHLORTHALIDONE 25 MG PO TABS: 25.0000 mg | ORAL_TABLET | Freq: Every day | ORAL | 0 refills | Status: DC

## 2024-08-09 ENCOUNTER — Encounter: Payer: Self-pay | Admitting: Family Medicine

## 2024-08-09 ENCOUNTER — Ambulatory Visit: Admitting: Family Medicine

## 2024-08-09 VITALS — BP 142/87 | HR 61 | Temp 98.3°F | Ht 64.5 in | Wt 255.0 lb

## 2024-08-09 DIAGNOSIS — Z6841 Body Mass Index (BMI) 40.0 and over, adult: Secondary | ICD-10-CM

## 2024-08-09 DIAGNOSIS — I10 Essential (primary) hypertension: Secondary | ICD-10-CM | POA: Diagnosis not present

## 2024-08-09 DIAGNOSIS — E66813 Obesity, class 3: Secondary | ICD-10-CM

## 2024-08-09 NOTE — Progress Notes (Signed)
 This Office: [phone]  /  Fax: [phone]   Initial Visit  Stephanie Hays was seen in clinic today to evaluate for obesity. She is interested in losing weight to improve overall health and reduce the risk of weight related complications. She presents today to review program treatment options, initial physical assessment, and evaluation.     She was referred by: Friend or Family  When asked what else they would like to accomplish? She states: Adopt a healthier eating pattern and lifestyle, Improve energy levels and physical activity, Improve existing medical conditions, Reduce number of medications, and Improve quality of life Would like to get back to 160s; has had more knee pain with weight gain; works from home  Weight history:  lost weight in 2021(268--> 167 lb using Optivia), regained slowly since then.  Started to eat more with adding in exercise; lives at home with husband and 3 kids: 80, 50, 12   When asked how has your weight affected you? She states: Contributed to medical problems and Contributed to orthopedic problems or mobility issues  Some associated conditions: Hypertension and Other: knee pain; heavy menses (vasectomy of husband)  Contributing factors: family history of obesity, moderate to high levels of stress, and sedentary job  Weight promoting medications identified: None  Current nutrition plan: None  Current level of physical activity: Strength training 30 minutes, five and Other: Zumba or walking  Current or previous pharmacotherapy: Phentermine and Other: compounded semaglutide  Response to medication: Had side effects so it was discontinued Had some nausea on semaglutide; did well on Phentermine  Past medical history includes:   Past Medical History:  Diagnosis Date   Atypical chest pain 12/19/2021   Endometriosis    Essential hypertension 01/22/2021   Headache    Hypertension    takes HCTZ and Valsartan daily   Hypokalemia 01/22/2021   Nocturia     Obesity (BMI 30.0-34.9) 12/19/2021   PONV (postoperative nausea and vomiting)    only happened because they had to give me extra anesthesia wmy last c-section; I had pre-eclampsia     Objective:   BP (!) 142/87   Pulse 61   Temp 98.3 F (36.8 C)   Ht 5' 4.5 (1.638 m)   Wt 255 lb (115.7 kg)   SpO2 100%   BMI 43.09 kg/m  She was weighed on the bioimpedance scale: Body mass index is 43.09 kg/m.  Peak Weight:265 , Body Fat%:45.7, Visceral Fat Rating:14, Weight trend over the last 12 months: Increasing  General:  Alert, oriented and cooperative. Patient is in no acute distress.  Respiratory: Normal respiratory effort, no problems with respiration noted   Gait: able to ambulate independently  Mental Status: Normal mood and affect. Normal behavior. Normal judgment and thought content.   DIAGNOSTIC DATA REVIEWED:  BMET    Component Value Date/Time   NA 139 09/15/2023 1156   K 3.7 09/15/2023 1156   CL 102 09/15/2023 1156   CO2 26 09/15/2023 1156   GLUCOSE 91 09/15/2023 1156   GLUCOSE 80 10/17/2015 1156   BUN 15 09/15/2023 1156   CREATININE 0.98 09/15/2023 1156   CALCIUM  9.4 09/15/2023 1156   GFRNONAA >60 10/17/2015 1156   GFRAA >60 10/17/2015 1156   No results found for: HGBA1C No results found for: INSULIN CBC    Component Value Date/Time   WBC 7.7 01/16/2023 0918   WBC 8.2 10/17/2015 1156   RBC 4.28 01/16/2023 0918   RBC 4.00 10/17/2015 1156   HGB 13.3 01/16/2023  0918   HCT 40.5 01/16/2023 0918   PLT 354 01/16/2023 0918   MCV 95 01/16/2023 0918   MCH 31.1 01/16/2023 0918   MCH 29.5 10/17/2015 1156   MCHC 32.8 01/16/2023 0918   MCHC 33.1 10/17/2015 1156   RDW 12.1 01/16/2023 0918   Iron/TIBC/Ferritin/ %Sat No results found for: IRON, TIBC, FERRITIN, IRONPCTSAT Lipid Panel  No results found for: CHOL, TRIG, HDL, CHOLHDL, VLDL, LDLCALC, LDLDIRECT Hepatic Function Panel     Component Value Date/Time   PROT 6.1 05/05/2012 1230    ALBUMIN 2.4 (L) 05/05/2012 1230   AST 47 (H) 05/05/2012 1230   ALT 36 (H) 05/05/2012 1230   ALKPHOS 89 05/05/2012 1230   BILITOT 0.5 05/05/2012 1230      Component Value Date/Time   TSH 1.390 01/16/2023 0918     Assessment and Plan:   Essential hypertension Blood pressure is elevated today.  She reports good compliance taking chlorthalidone  25 mg once daily and spironolactone  25 mg once daily.  She denies chest pain or headache.  Continue current antihypertensive medications looking for improvements with weight loss.  Obesity, Class III, BMI 40-49.9 (morbid obesity) (HCC)  BMI 40.0-44.9, adult (HCC)        Obesity Treatment / Action Plan:  Patient will work on garnering support from family and friends to begin weight loss journey. Will work on eliminating or reducing the presence of highly palatable, calorie dense foods in the home. Will complete provided nutritional and psychosocial assessment questionnaire before the next appointment. Will be scheduled for indirect calorimetry to determine resting energy expenditure in a fasting state.  This will allow us  to create a reduced calorie, high-protein meal plan to promote loss of fat mass while preserving muscle mass. Will think about ideas on how to incorporate physical activity into their daily routine. Was counseled on nutritional approaches to weight loss and benefits of reducing processed foods and consuming plant-based foods and high quality protein as part of nutritional weight management. Was counseled on pharmacotherapy and role as an adjunct in weight management.   Obesity Education Performed Today:  She was weighed on the bioimpedance scale and results were discussed and documented in the synopsis.  We discussed obesity as a disease and the importance of a more detailed evaluation of all the factors contributing to the disease.  We discussed the importance of long term lifestyle changes which include nutrition,  exercise and behavioral modifications as well as the importance of customizing this to her specific health and social needs.  We discussed the benefits of reaching a healthier weight to alleviate the symptoms of existing conditions and reduce the risks of the biomechanical, metabolic and psychological effects of obesity.  Stephanie Hays appears to be in the action stage of change and states they are ready to start intensive lifestyle modifications and behavioral modifications.  20 minutes was spent today on this visit including the above counseling, pre-visit chart review, and post-visit documentation.  Reviewed by clinician on day of visit: allergies, medications, problem list, medical history, surgical history, family history, social history, and previous encounter notes pertinent to obesity diagnosis.    Darice Haddock, D.O. DABFM, [facility] [address] [place], [place] [address] [phone]

## 2024-08-18 ENCOUNTER — Other Ambulatory Visit (HOSPITAL_BASED_OUTPATIENT_CLINIC_OR_DEPARTMENT_OTHER): Payer: Self-pay | Admitting: Cardiovascular Disease

## 2024-09-02 ENCOUNTER — Ambulatory Visit (INDEPENDENT_AMBULATORY_CARE_PROVIDER_SITE_OTHER): Admitting: Family Medicine

## 2024-09-02 ENCOUNTER — Encounter: Payer: Self-pay | Admitting: Family Medicine

## 2024-09-02 VITALS — BP 135/89 | HR 60 | Temp 98.0°F | Ht 64.5 in | Wt 260.0 lb

## 2024-09-02 DIAGNOSIS — R5383 Other fatigue: Secondary | ICD-10-CM

## 2024-09-02 DIAGNOSIS — Z1331 Encounter for screening for depression: Secondary | ICD-10-CM

## 2024-09-02 DIAGNOSIS — I10 Essential (primary) hypertension: Secondary | ICD-10-CM | POA: Diagnosis not present

## 2024-09-02 DIAGNOSIS — N809 Endometriosis, unspecified: Secondary | ICD-10-CM | POA: Diagnosis not present

## 2024-09-02 DIAGNOSIS — N951 Menopausal and female climacteric states: Secondary | ICD-10-CM

## 2024-09-02 DIAGNOSIS — E559 Vitamin D deficiency, unspecified: Secondary | ICD-10-CM | POA: Diagnosis not present

## 2024-09-02 DIAGNOSIS — E66813 Obesity, class 3: Secondary | ICD-10-CM

## 2024-09-02 DIAGNOSIS — R0602 Shortness of breath: Secondary | ICD-10-CM | POA: Diagnosis not present

## 2024-09-02 DIAGNOSIS — Z6841 Body Mass Index (BMI) 40.0 and over, adult: Secondary | ICD-10-CM

## 2024-09-02 NOTE — Progress Notes (Signed)
 At a Glance:  Vitals Temp: 98 F (36.7 C) BP: 135/89 Pulse Rate: 60 SpO2: 98 %   Anthropometric Measurements Height: 5' 4.5 (1.638 m) Weight: 260 lb (117.9 kg) BMI (Calculated): 43.96 Starting Weight: 260lb Peak Weight: 265lb   Body Composition  Body Fat %: 46.7 % Fat Mass (lbs): 121.6 lbs Muscle Mass (lbs): 131.6 lbs Total Body Water (lbs): 93.4 lbs Visceral Fat Rating : 14   Other Clinical Data RMR: 1742 Fasting: Yes Labs: Yes Today's Visit #: 1 Starting Date: 09/02/24    EKG: Normal sinus rhythm, rate 60.  Indirect Calorimeter completed today shows a VO2 of 252 and a REE of 1742.  Her calculated basal metabolic rate is 8033 thus her basal metabolic rate is worse than expected.  Chief Complaint:  Obesity   Subjective:  Stephanie Hays (MR# [id number]) is a 43 y.o. female who presents for evaluation and treatment of obesity and related comorbidities.   Stephanie Hays is currently in the action stage of change and ready to dedicate time achieving and maintaining a healthier weight. Stephanie Hays is interested in becoming our patient and working on intensive lifestyle modifications including (but not limited to) diet and exercise for weight loss.  Stephanie Hays has been struggling with her weight. She has been unsuccessful in either losing weight, maintaining weight loss, or reaching her healthy weight goal.  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  She does exercise on a regular basis.  Her husband had a vasectomy.  She has previously tried phentermine and compounded semaglutide with limited success.  She has started going through hormone hormone changes with vasomotor flushing and difficulty sleeping at night.  Stephanie Hays's habits were reviewed today and are as follows: Her family eats meals together, her desired weight loss is 100 pounds, she started gaining weight after having kids.  She lost 100 pounds and [employer] in 2021 and has been regaining ever  since. she has significant food cravings issues, she has problems with excessive hunger, she frequently eats larger portions than normal, and she struggles with emotional eating.   Other Fatigue Stephanie Hays admits to daytime somnolence and denies waking up still tired.  Stephanie Hays generally gets 7 or 8 hours of sleep per night, and states that she has nightime awakenings. Snoring is present. Apneic episodes are not present. Epworth Sleepiness Score is 4.   Shortness of Breath Stephanie Hays notes increasing shortness of breath with exercising and seems to be worsening over time with weight gain. She notes getting out of breath sooner with activity than she used to. This has gotten worse recently. Stephanie Hays denies shortness of breath at rest or orthopnea.   Depression Screen Stephanie Hays's Food and Mood (modified PHQ-9) score was 8.     09/02/2024    8:16 AM  Depression screen PHQ 2/9  Decreased Interest 0  Down, Depressed, Hopeless 1  PHQ - 2 Score 1  Altered sleeping 1  Tired, decreased energy 1  Change in appetite 1  Feeling bad or failure about yourself  0  Trouble concentrating 0  Moving slowly or fidgety/restless 0  Suicidal thoughts 0  PHQ-9 Score 4  Difficult doing work/chores Not difficult at all     Assessment and Plan:   Other Fatigue Macenzie does feel that her weight is causing her energy to be lower than it should be. Fatigue may be related to obesity, depression or many other causes. Labs will be ordered, and in the meanwhile, Stephanie Hays will focus  on self care including making healthy food choices, increasing physical activity and focusing on stress reduction.  Shortness of Breath Stephanie Hays does not know feel that she gets out of breath more easily that she used to when she exercises. Stephanie Hays's shortness of breath appears to be obesity related and exercise induced. She has agreed to work on weight loss and gradually increase exercise to treat her exercise induced shortness of breath. Will continue to monitor  closely.   Problem List Items Addressed This Visit     Essential hypertension Blood pressure is slightly elevated today.  She has not yet taken her morning dose of chlorthalidone  25 mg. Look for blood pressure improvements with weight reduction.   Other Visit Diagnoses       SOBOE (shortness of breath on exertion)    -  Primary     Other fatigue       Relevant Orders   EKG 12-Lead (Completed)   TSH   T4, free   T3   Insulin, random   Hemoglobin A1c   Folate   Comprehensive metabolic panel with GFR   Vitamin B12   CBC with Differential/Platelet   Cortisol     Depression screen    Reviewed results from PHQ-9     Perimenopausal   She has been experiencing hormone changes with vasomotor flushing and difficulty sleeping at night.  Check progesterone and estradiol level today.  She is still having regular menstrual cycles.   Relevant Orders   Progesterone   Estradiol     [name] deficiency  Her [name] level has been low.  She has not been taking [name] supplement.  Check level today      Relevant Orders   [name] 25 Hydroxy (Vit-D Deficiency, Fractures)     Obesity, Class III, BMI 40-49.9 (morbid obesity) (HCC)         BMI 40.0-44.9, adult (HCC)         Endometriosis     She still has fairly heavy menses from endometriosis.  She is not on any hormonal treatments and continues to have regular cycles.  Follow-up with OB/GYN for any continued pain or menorrhagia.       Stephanie Hays is currently in the action stage of change and her goal is to get back to weightloss efforts . I recommend Stephanie Hays begin the structured treatment plan as follows:  She has agreed to Category 3 Plan  Exercise goals: For substantial health benefits, adults should do at least 150 minutes (2 hours and 30 minutes) a week of moderate-intensity, or 75 minutes (1 hour and 15 minutes) a week of vigorous-intensity aerobic physical activity, or an equivalent combination of moderate- and vigorous-intensity  aerobic activity. Aerobic activity should be performed in episodes of at least 10 minutes, and preferably, it should be spread throughout the week.  Behavioral modification strategies:increasing lean protein intake, increasing vegetables, increase H2O intake, decrease liquid calories, decreasing eating out, no skipping meals, meal planning and cooking strategies, keeping healthy foods in the home, better snacking choices, planning for success, and decrease junk food   She was informed of the importance of frequent follow-up visits to maximize her success with intensive lifestyle modifications for her multiple health conditions. She was informed we would discuss her lab results at her next visit unless there is a critical issue that needs to be addressed sooner. Thomas agreed to keep her next visit at the agreed upon time to discuss these results.  Objective:  General: Cooperative,  alert, well developed, in no acute distress. HEENT: Conjunctivae and lids unremarkable. Cardiovascular: Regular rhythm.  Lungs: Normal work of breathing. Neurologic: No focal deficits.   Lab Results  Component Value Date   CREATININE 0.98 09/15/2023   BUN 15 09/15/2023   NA 139 09/15/2023   K 3.7 09/15/2023   CL 102 09/15/2023   CO2 26 09/15/2023   Lab Results  Component Value Date   ALT 36 (H) 05/05/2012   AST 47 (H) 05/05/2012   ALKPHOS 89 05/05/2012   BILITOT 0.5 05/05/2012   No results found for: HGBA1C No results found for: INSULIN Lab Results  Component Value Date   TSH 1.390 01/16/2023   No results found for: CHOL, HDL, LDLCALC, LDLDIRECT, TRIG, CHOLHDL Lab Results  Component Value Date   WBC 7.7 01/16/2023   HGB 13.3 01/16/2023   HCT 40.5 01/16/2023   MCV 95 01/16/2023   PLT 354 01/16/2023   No results found for: IRON, TIBC, FERRITIN  Attestation Statements:  Reviewed by clinician on day of visit: allergies, medications, problem list, medical history, surgical  history, family history, social history, and previous encounter notes.  Time spent on visit including pre-visit chart review and post-visit charting and face- to face care including nutritional counseling, review of EKG, interpretation of body composition scale and indirect calorimetry and nutrition prescription  was 40 minutes.   Darice Haddock, D.O. DABFM, DABOM [facility] [address] [place], [place] [address] [phone]

## 2024-09-03 LAB — CBC WITH DIFFERENTIAL/PLATELET
Basophils Absolute: 0 x10E3/uL (ref 0.0–0.2)
Basos: 1 %
EOS (ABSOLUTE): 0.2 x10E3/uL (ref 0.0–0.4)
Eos: 3 %
Hematocrit: 39.9 % (ref 34.0–46.6)
Hemoglobin: 13.6 g/dL (ref 11.1–15.9)
Immature Grans (Abs): 0 x10E3/uL (ref 0.0–0.1)
Immature Granulocytes: 0 %
Lymphocytes Absolute: 2 x10E3/uL (ref 0.7–3.1)
Lymphs: 28 %
MCH: 32.5 pg (ref 26.6–33.0)
MCHC: 34.1 g/dL (ref 31.5–35.7)
MCV: 95 fL (ref 79–97)
Monocytes Absolute: 0.4 x10E3/uL (ref 0.1–0.9)
Monocytes: 6 %
Neutrophils Absolute: 4.6 x10E3/uL (ref 1.4–7.0)
Neutrophils: 62 %
Platelets: 385 x10E3/uL (ref 150–450)
RBC: 4.19 x10E6/uL (ref 3.77–5.28)
RDW: 12.7 % (ref 11.7–15.4)
WBC: 7.3 x10E3/uL (ref 3.4–10.8)

## 2024-09-03 LAB — VITAMIN D 25 HYDROXY (VIT D DEFICIENCY, FRACTURES): Vit D, 25-Hydroxy: 39.7 ng/mL (ref 30.0–100.0)

## 2024-09-03 LAB — ESTRADIOL: Estradiol: 46.4 pg/mL

## 2024-09-03 LAB — COMPREHENSIVE METABOLIC PANEL WITH GFR
ALT: 22 IU/L (ref 0–32)
AST: 29 IU/L (ref 0–40)
Albumin: 4.3 g/dL (ref 3.9–4.9)
Alkaline Phosphatase: 71 IU/L (ref 41–116)
BUN/Creatinine Ratio: 18 (ref 9–23)
BUN: 19 mg/dL (ref 6–24)
Bilirubin Total: 0.5 mg/dL (ref 0.0–1.2)
CO2: 23 mmol/L (ref 20–29)
Calcium: 9.4 mg/dL (ref 8.7–10.2)
Chloride: 99 mmol/L (ref 96–106)
Creatinine, Ser: 1.07 mg/dL — ABNORMAL HIGH (ref 0.57–1.00)
Globulin, Total: 3 g/dL (ref 1.5–4.5)
Glucose: 91 mg/dL (ref 70–99)
Potassium: 3.6 mmol/L (ref 3.5–5.2)
Sodium: 139 mmol/L (ref 134–144)
Total Protein: 7.3 g/dL (ref 6.0–8.5)
eGFR: 66 mL/min/1.73 (ref >59)

## 2024-09-03 LAB — HEMOGLOBIN A1C
Est. average glucose Bld gHb Est-mCnc: 94 mg/dL
Hgb A1c MFr Bld: 4.9 % (ref 4.8–5.6)

## 2024-09-03 LAB — FOLATE: Folate: 13.9 ng/mL (ref >3.0)

## 2024-09-03 LAB — T4, FREE: Free T4: 1.26 ng/dL (ref 0.82–1.77)

## 2024-09-03 LAB — VITAMIN B12: Vitamin B-12: 716 pg/mL (ref 232–1245)

## 2024-09-03 LAB — INSULIN, RANDOM: INSULIN: 20.9 u[IU]/mL (ref 2.6–24.9)

## 2024-09-03 LAB — CORTISOL: Cortisol: 6.6 ug/dL (ref 6.2–19.4)

## 2024-09-03 LAB — T3: T3, Total: 120 ng/dL (ref 71–180)

## 2024-09-03 LAB — TSH: TSH: 1.16 u[IU]/mL (ref 0.450–4.500)

## 2024-09-03 LAB — PROGESTERONE: Progesterone: 0.3 ng/mL

## 2024-09-06 ENCOUNTER — Ambulatory Visit: Payer: Self-pay | Admitting: Family Medicine

## 2024-09-16 ENCOUNTER — Telehealth: Payer: Self-pay | Admitting: *Deleted

## 2024-09-16 ENCOUNTER — Encounter: Payer: Self-pay | Admitting: Family Medicine

## 2024-09-16 ENCOUNTER — Ambulatory Visit (INDEPENDENT_AMBULATORY_CARE_PROVIDER_SITE_OTHER): Admitting: Family Medicine

## 2024-09-16 VITALS — BP 126/82 | HR 61 | Temp 97.9°F | Ht 64.5 in | Wt 253.0 lb

## 2024-09-16 DIAGNOSIS — Z6841 Body Mass Index (BMI) 40.0 and over, adult: Secondary | ICD-10-CM | POA: Diagnosis not present

## 2024-09-16 DIAGNOSIS — N951 Menopausal and female climacteric states: Secondary | ICD-10-CM | POA: Diagnosis not present

## 2024-09-16 DIAGNOSIS — E88819 Insulin resistance, unspecified: Secondary | ICD-10-CM

## 2024-09-16 DIAGNOSIS — E66813 Obesity, class 3: Secondary | ICD-10-CM | POA: Diagnosis not present

## 2024-09-16 MED ORDER — ZEPBOUND 2.5 MG/0.5ML ~~LOC~~ SOAJ: 2.5000 mg | SUBCUTANEOUS | 0 refills | Status: DC

## 2024-09-16 NOTE — Patient Instructions (Addendum)
 You can add one additional fresh or frozen fruit daily  Plan:  Check out [email or web address] for more information Begin [name] 2,000 international units daily  Keep up workouts : Weight training 3 days/ wk Zumba 2 x a week

## 2024-09-16 NOTE — Telephone Encounter (Signed)
 Prior authorization done via cover my meds for patients Zepbound. Waiting on determination.

## 2024-09-16 NOTE — Progress Notes (Signed)
 Office: [phone]  /  Fax: [phone]  WEIGHT SUMMARY AND BIOMETRICS  Starting Date: 09/02/24  Starting Weight: 260lb   Weight Lost Since Last Visit: 7lb   Vitals Temp: 97.9 F (36.6 C) BP: 126/82 Pulse Rate: 61 SpO2: 96 %   Body Composition  Body Fat %: 43.8 % Fat Mass (lbs): 111 lbs Muscle Mass (lbs): 135 lbs Total Body Water (lbs): 89.9 lbs Visceral Fat Rating : 13    HPI  Chief Complaint: OBESITY  Stephanie Hays is here to discuss her progress with her obesity treatment plan. She is on the the Category 3 Plan and states she is following her eating plan approximately 98 % of the time. She states she is exercising 45-60 minutes 4 times per week.  Interval History:  Since last office visit she is down 7 lb She is up 4.4 lb of muscle mass and down 10.6 lb of body fat since last visit She has felt plenty full at the end  of her meal eating on plan Hunger and cravings have improved She is planning meals better She is using her snack calories She is already doing Zumba and strength training 2 days/ wk She has a good support system  Pharmacotherapy: none  PHYSICAL EXAM:  Blood pressure 126/82, pulse 61, temperature 97.9 F (36.6 C), height 5' 4.5 (1.638 m), weight 253 lb (114.8 kg), SpO2 96%. Body mass index is 42.76 kg/m.  General: She is overweight, cooperative, alert, well developed, and in no acute distress. PSYCH: Has normal mood, affect and thought process.   Lungs: Normal breathing effort, no conversational dyspnea.  ASSESSMENT AND PLAN  TREATMENT PLAN FOR OBESITY:  Recommended Dietary Goals  Stephanie Hays is currently in the action stage of change. As such, her goal is to continue weight management plan. She has agreed to the Category 3 Plan. She can add one additional fresh or frozen fruit serving daily  Behavioral Intervention  We discussed the following Behavioral Modification Strategies today: increasing lean protein intake to established goals,  increasing fiber rich foods, increasing water intake , work on meal planning and preparation, keeping healthy foods at home, continue to practice mindfulness when eating, and planning for success.  Additional resources provided today: NA  Recommended Physical Activity Goals  Adelee has been advised to work up to 150 minutes of moderate intensity aerobic activity a week and strengthening exercises 2-3 times per week for cardiovascular health, weight loss maintenance and preservation of muscle mass.   She has agreed to Continue current level of physical activity   Pharmacotherapy changes for the treatment of obesity: begin Zepbound 2.5 mg weekly injection Patient denies a personal or family history of pancreatitis, medullary thyroid carcinoma or multiple endocrine neoplasia type II. Recommend reviewing pen training video online.  ASSOCIATED CONDITIONS ADDRESSED TODAY  Insulin resistance New Reviewed lab findings with patient from last visit Fasting insulin elevated at 20.9 She is not on metformin and does not have T2DM or PDM She is actively working on weight loss with dietary and exercise changes Reviewed how IR can contribute to cardiometabolic disease and hyperphagia Look for improvements in the next 3-4 mos with use of Zepbound + weight loss  Obesity, Class III, BMI 40-49.9 (morbid obesity) (HCC) -     Zepbound; Inject 2.5 mg into the skin once a week.  Dispense: 2 mL; Refill: 0 Patient was counseled on the importance of maintaining healthy lifestyle habits, including balanced nutrition, regular physical activity, and behavioral modifications, while taking antiobesity medication.  Patient verbalized understanding that medication is an adjunct to, not a replacement for, lifestyle changes and that the long-term success and weight maintenance depend on continued adherence to these strategies.  BMI 40.0-44.9, adult [facility])  Perimenopausal Reviewed labs with patient Progesterone low normal  which explains sleep disruption at night with vasomotor symptoms and irregular menses    She was informed of the importance of frequent follow up visits to maximize her success with intensive lifestyle modifications for her multiple health conditions.   ATTESTASTION STATEMENTS:  Reviewed by clinician on day of visit: allergies, medications, problem list, medical history, surgical history, family history, social history, and previous encounter notes pertinent to obesity diagnosis.   I have personally spent 30 minutes total time today in preparation, patient care, nutritional counseling and education,  and documentation for this visit, including the following: review of most recent clinical lab tests, prescribing medications/ refilling medications, reviewing medical assistant documentation, review and interpretation of bioimpedence results.     Darice Haddock, D.O. DABFM, DABOM [facility] [address] [place], [place] [address] [phone]

## 2024-09-22 NOTE — Telephone Encounter (Signed)
 Prior authorization approved for patients Zepbound.  Message from Plan CaseId:[id number];Status:Approved;Review Type:Prior Auth;Coverage Start Date:08/17/2024;Coverage End Date:05/14/2025;. Authorization Expiration Date: May 14, 2025.

## 2024-09-25 ENCOUNTER — Encounter (HOSPITAL_BASED_OUTPATIENT_CLINIC_OR_DEPARTMENT_OTHER): Payer: Self-pay | Admitting: Cardiovascular Disease

## 2024-09-27 ENCOUNTER — Other Ambulatory Visit (HOSPITAL_BASED_OUTPATIENT_CLINIC_OR_DEPARTMENT_OTHER): Payer: Self-pay

## 2024-09-27 MED ORDER — SPIRONOLACTONE 25 MG PO TABS
25.0000 mg | ORAL_TABLET | Freq: Every day | ORAL | 0 refills | Status: DC
  Filled 2024-09-27: qty 60, 60d supply, fill #0

## 2024-10-13 ENCOUNTER — Encounter: Payer: Self-pay | Admitting: Family Medicine

## 2024-10-13 ENCOUNTER — Ambulatory Visit: Admitting: Family Medicine

## 2024-10-13 VITALS — BP 126/84 | HR 62 | Temp 98.0°F | Ht 64.5 in | Wt 246.0 lb

## 2024-10-13 DIAGNOSIS — Z6841 Body Mass Index (BMI) 40.0 and over, adult: Secondary | ICD-10-CM

## 2024-10-13 DIAGNOSIS — E88819 Insulin resistance, unspecified: Secondary | ICD-10-CM

## 2024-10-13 DIAGNOSIS — I10 Essential (primary) hypertension: Secondary | ICD-10-CM

## 2024-10-13 MED ORDER — ZEPBOUND 5 MG/0.5ML ~~LOC~~ SOAJ: 5.0000 mg | SUBCUTANEOUS | 0 refills | Status: DC

## 2024-10-13 NOTE — Progress Notes (Signed)
 Office: [phone]  /  Fax: [phone]  WEIGHT SUMMARY AND BIOMETRICS  Starting Date: 09/02/24  Starting Weight: 260lb   Weight Lost Since Last Visit: 7lb   Vitals Temp: 98 F (36.7 C) BP: 126/84 Pulse Rate: 62 SpO2: 98 %   Body Composition  Body Fat %: 43.9 % Fat Mass (lbs): 108 lbs Muscle Mass (lbs): 131 lbs Total Body Water (lbs): 89.4 lbs Visceral Fat Rating : 13    HPI  Chief Complaint: OBESITY  Stephanie Hays is here to discuss her progress with her obesity treatment plan. She is on the the Category 3 Plan and states she is following her eating plan approximately 85 % of the time. She states she is exercising 45-60 minutes 3-4 times per week.  Interval History:  Since last office visit she is down 7 lb She is down 3 lb of body fat since last visit This gives her a net weight loss of 14 lb in 1.5 mos of medically supervised weight management This is a 5.3% TBW loss She has a good support system at home She is mostly eating on cat 3 meal plan, adding in more fruit She has been consistent with workouts, 3-4 x a week She has completed 4 injections of Zepbound  2.5 mg weekly with improved appetite control and without meal skipping or GI upset  Pharmacotherapy: Zepbound  2.5 mg weekly  PHYSICAL EXAM:  Blood pressure 126/84, pulse 62, temperature 98 F (36.7 C), height 5' 4.5 (1.638 m), weight 246 lb (111.6 kg), SpO2 98%. Body mass index is 41.57 kg/m.  General: She is overweight, cooperative, alert, well developed, and in no acute distress. PSYCH: Has normal mood, affect and thought process.   Lungs: Normal breathing effort, no conversational dyspnea.   ASSESSMENT AND PLAN  TREATMENT PLAN FOR OBESITY:  Recommended Dietary Goals  Shatonya is currently in the action stage of change. As such, her goal is to continue weight management plan. She has agreed to the Category 3 Plan.  Behavioral Intervention  We discussed the following Behavioral Modification  Strategies today: increasing lean protein intake to established goals, increasing fiber rich foods, avoiding skipping meals, increasing water intake , work on meal planning and preparation, keeping healthy foods at home, avoiding temptations and identifying enticing environmental cues, continue to work on implementation of reduced calorie nutritional plan, continue to practice mindfulness when eating, and planning for success.  Additional resources provided today: NA  Recommended Physical Activity Goals  Julian has been advised to work up to 150 minutes of moderate intensity aerobic activity a week and strengthening exercises 2-3 times per week for cardiovascular health, weight loss maintenance and preservation of muscle mass.   She has agreed to [employer] strengthening exercises with a goal of 2-3 sessions a week  and Increase the intensity, frequency or duration of aerobic exercises   Increase workouts to 4 days/ wk consistently  Pharmacotherapy changes for the treatment of obesity: increase Zepbound  to 5 mg weekly  ASSOCIATED CONDITIONS ADDRESSED TODAY  Insulin  resistance Fasting insulin  elevated at 20.9 She is working on reducing intake of sugar and starch on prescribed meal plan She is regularly working out 3-4 days/ wk  Continue active plan for weight loss Repeat fasting insulin  in 3 mos  Morbid obesity (HCC) -     Zepbound ; Inject 5 mg into the skin once a week.  Dispense: 2 mL; Refill: 0 Doing well with new start Zepbound  without meal skipping or GI upset  Increase dose to 5 mg weekly  Recommend adding a daily fiber supplement like Metamucil + full glass of water for small caliber stools without straining  Patient was counseled on the importance of maintaining healthy lifestyle habits, including balanced nutrition, regular physical activity, and behavioral modifications, while taking antiobesity medication.  Patient verbalized understanding that medication is an adjunct to, not a  replacement for, lifestyle changes and that the long-term success and weight maintenance depend on continued adherence to these strategies.  BMI 40.0-44.9, adult (HCC)  Essential hypertension BP is at goal on chlorthalidone  25 mg daily Avoid excess intake of high sodium foods and drinks, limiting meals out      She was informed of the importance of frequent follow up visits to maximize her success with intensive lifestyle modifications for her multiple health conditions.   ATTESTASTION STATEMENTS:  Reviewed by clinician on day of visit: allergies, medications, problem list, medical history, surgical history, family history, social history, and previous encounter notes pertinent to obesity diagnosis.   I have personally spent 30 minutes total time today in preparation, patient care, nutritional counseling and education,  and documentation for this visit, including the following: review of most recent clinical lab tests, prescribing medications/ refilling medications, reviewing medical assistant documentation, review and interpretation of bioimpedence results.     Darice Haddock, D.O. DABFM, DABOM [facility] [address] [place], [place] [address] [phone]

## 2024-10-13 NOTE — Patient Instructions (Signed)
 Go up on Zepbound  to 5 mg weekly injection  Call if any problems or questions  Add Metamucil with a full glass of water daily  Great job with workouts!  Aim for 4 days/ wk  Continue to work on meal planning, mindful eating and portion control

## 2024-10-14 ENCOUNTER — Ambulatory Visit: Admitting: Family Medicine

## 2024-11-08 ENCOUNTER — Other Ambulatory Visit (HOSPITAL_BASED_OUTPATIENT_CLINIC_OR_DEPARTMENT_OTHER): Payer: Self-pay | Admitting: Family

## 2024-11-15 ENCOUNTER — Ambulatory Visit: Admitting: Family Medicine

## 2024-11-15 ENCOUNTER — Encounter: Payer: Self-pay | Admitting: Family Medicine

## 2024-11-15 VITALS — BP 124/82 | HR 62 | Temp 98.6°F | Ht 64.5 in | Wt 236.0 lb

## 2024-11-15 DIAGNOSIS — K5903 Drug induced constipation: Secondary | ICD-10-CM | POA: Diagnosis not present

## 2024-11-15 DIAGNOSIS — I10 Essential (primary) hypertension: Secondary | ICD-10-CM | POA: Diagnosis not present

## 2024-11-15 DIAGNOSIS — E88819 Insulin resistance, unspecified: Secondary | ICD-10-CM | POA: Diagnosis not present

## 2024-11-15 DIAGNOSIS — Z6839 Body mass index (BMI) 39.0-39.9, adult: Secondary | ICD-10-CM

## 2024-11-15 MED ORDER — ZEPBOUND 5 MG/0.5ML ~~LOC~~ SOAJ: 5.0000 mg | SUBCUTANEOUS | 0 refills | Status: DC

## 2024-11-15 NOTE — Patient Instructions (Signed)
 Keep up the good work!  Great job with workouts!  Aim for 80-100 g of protein intake daily  Hydrate well with water and sugar free electrolyte drinks

## 2024-11-15 NOTE — Progress Notes (Signed)
 "  Office: [phone]  /  Fax: [phone]  WEIGHT SUMMARY AND BIOMETRICS  Starting Date: 09/02/24  Starting Weight: 260lb   Weight Lost Since Last Visit: 10lb   Vitals Temp: 98.6 F (37 C) BP: 124/82 Pulse Rate: 62 SpO2: 99 %   Body Composition  Body Fat %: 42.3 % Fat Mass (lbs): 99.8 lbs Muscle Mass (lbs): 129.4 lbs Total Body Water (lbs): 86.2 lbs Visceral Fat Rating : 12    HPI  Chief Complaint: OBESITY  Stephanie Hays is here to discuss her progress with her obesity treatment plan. She is on the the Category 3 Plan and states she is following her eating plan approximately 80 % of the time. She states she is exercising 60 minutes 4-5 times per week.  Interval History:  Since last office visit she is down 10 lb She is down 1.6 pounds of muscle mass and down 8.2 pounds of body fat since last visit She has a net weight loss of 24 lb in the past 2 mos of medically supervised weight management This is a 9.2% total body weight loss She felt more appetite control on Zepbound  5 mg weekly She was able to stay mindful over the holidays She denies GERD or nausea Portion sizes are smaller and cravings are less She plans to work on her water intake  She is working out with a trainer and on her own for a total of 4-5 days/ wk Constipation has improved adding in Metamucil and water daily  Pharmacotherapy: Zepbound  5 mg once weekly injection  PHYSICAL EXAM:  Blood pressure 124/82, pulse 62, temperature 98.6 F (37 C), height 5' 4.5 (1.638 m), weight 236 lb (107 kg), SpO2 99%. Body mass index is 39.88 kg/m.  General: She is overweight, cooperative, alert, well developed, and in no acute distress. PSYCH: Has normal mood, affect and thought process.   Lungs: Normal breathing effort, no conversational dyspnea.   ASSESSMENT AND PLAN  TREATMENT PLAN FOR OBESITY:  Recommended Dietary Goals  Stephanie Hays is currently in the action stage of change. As such, her goal is to continue  weight management plan. She has agreed to the Category 3 Plan. Check daily protein intake with a goal of 80 to 100 g/day  Behavioral Intervention  We discussed the following Behavioral Modification Strategies today: increasing lean protein intake to established goals, increasing fiber rich foods, increasing water intake , work on meal planning and preparation, keeping healthy foods at home, continue to work on implementation of reduced calorie nutritional plan, continue to practice mindfulness when eating, and planning for success.  Additional resources provided today: NA  Recommended Physical Activity Goals  Stephanie Hays has been advised to work up to 150 minutes of moderate intensity aerobic activity a week and strengthening exercises 2-3 times per week for cardiovascular health, weight loss maintenance and preservation of muscle mass.   She has agreed to Continue current level of physical activity   Pharmacotherapy changes for the treatment of obesity: None  ASSOCIATED CONDITIONS ADDRESSED TODAY  Insulin  resistance Last fasting insulin  elevated on 09/02/2024 at 20.9.  She is doing well with lifestyle changes including diet and exercise changes as well as weight reduction.  Repeat fasting insulin  in the next 2 to 3 months.  Morbid obesity (HCC) -     Zepbound ; Inject 5 mg into the skin once a week.  Dispense: 2 mL; Refill: 0 She has adequate satiety and pace of late weight loss on the Zepbound  5 mg once weekly injection without adverse  side effect.  She denies meal skipping and has incorporated both cardio and resistance training exercise 4 to 5 days a week.  Patient was counseled on the importance of maintaining healthy lifestyle habits, including balanced nutrition, regular physical activity, and behavioral modifications, while taking antiobesity medication.  Patient verbalized understanding that medication is an adjunct to, not a replacement for, lifestyle changes and that the long-term  success and weight maintenance depend on continued adherence to these strategies.  BMI 39.0-39.9,adult  Drug induced constipation Improved by adding Metamucil once daily.  Increase water intake to at least 64 ounces daily  Essential hypertension Well-controlled Continue chlorthalidone  25 mg once daily per PCP.  Look for further improvements with weight reduction.     She was informed of the importance of frequent follow up visits to maximize her success with intensive lifestyle modifications for her multiple health conditions.   ATTESTASTION STATEMENTS:  Reviewed by clinician on day of visit: allergies, medications, problem list, medical history, surgical history, family history, social history, and previous encounter notes pertinent to obesity diagnosis.   I personally spent a total of 23 minutes in the care of the patient today including preparing to see the patient, getting/reviewing separately obtained history, performing a medically appropriate exam/evaluation, counseling and educating, placing orders, and documenting clinical information in the EHR.    Stephanie Hays, D.O. DABFM, DABOM [facility] [address] [place], [place] [address] [phone] "

## 2024-11-19 ENCOUNTER — Encounter (HOSPITAL_BASED_OUTPATIENT_CLINIC_OR_DEPARTMENT_OTHER): Payer: Self-pay | Admitting: Family

## 2024-11-19 ENCOUNTER — Ambulatory Visit (INDEPENDENT_AMBULATORY_CARE_PROVIDER_SITE_OTHER): Admitting: Family

## 2024-11-19 VITALS — BP 115/68 | HR 63 | Resp 17 | Ht 64.0 in | Wt 243.0 lb

## 2024-11-19 DIAGNOSIS — E876 Hypokalemia: Secondary | ICD-10-CM | POA: Diagnosis not present

## 2024-11-19 DIAGNOSIS — I10 Essential (primary) hypertension: Secondary | ICD-10-CM | POA: Diagnosis not present

## 2024-11-19 DIAGNOSIS — R002 Palpitations: Secondary | ICD-10-CM

## 2024-11-19 MED ORDER — SPIRONOLACTONE 25 MG PO TABS: 25.0000 mg | ORAL_TABLET | Freq: Every day | ORAL | 3 refills | Status: AC

## 2024-11-19 MED ORDER — CHLORTHALIDONE 25 MG PO TABS: 25.0000 mg | ORAL_TABLET | Freq: Every day | ORAL | 3 refills | Status: AC

## 2024-11-19 MED ORDER — POTASSIUM CHLORIDE CRYS ER 10 MEQ PO TBCR: 20.0000 meq | EXTENDED_RELEASE_TABLET | Freq: Every day | ORAL | 3 refills | Status: AC

## 2024-11-19 NOTE — Progress Notes (Signed)
 " Cardiology Office Note:  .   Date:  11/19/2024  ID:  Stephanie Hays, DOB July 13, 1981, MRN [id number] PCP: Loring Leeds, NP  [facility] Providers Cardiologist:  Annabella Scarce, MD    History of Present Illness: .   Stephanie Hays is a 44 y.o. female with a hx of HTN, endometriosis last seen 01/16/23.   Seen 04/09/22 noting chest discomfort. Subsequent coronary CTA with calcium  score of 0. She was seen in follow up 06/04/22 noting occasional sensation of chest pain that happens at different spots on her chest predominantly at rest. Reassurance provided and she was encouraged to gradually increase her activity to goal of 150 minutes/week.  Last seen 01/16/23 noting increased palpitations. Subsequently monitor with NSR and rare PVC/PAC with <1% burden. Triggered episodes associated with NSR or PVC.  She was seen 05/23/23. Reassurance provided regarding recent monitor. Chlorthalidone  reduced to 12.5mg  daily to avoid dehydration. She politely declined PRN beta blocker. Chlorthalidone  later returned to 25mg  dose due to elevated blood pressure.  Presents today for follow up. Doing well since last seen. Reports occasional palpitations which are not limiting. Reports no shortness of breath nor dyspnea on exertion. Reports no chest pain, pressure, or tightness. No edema, orthopnea, PND. Working closely with [facility], tolerating Zepbound  well with successful weight loss.  Does note difficulty with size of potassium tablet.  The 10-year ASCVD risk score (Arnett DK, et al., 2019) is: 1.2%   Values used to calculate the score:     Age: 44 years     Clinically relevant sex: Female     Is Non-Hispanic African American: Yes     Diabetic: No     Tobacco smoker: No     Systolic Blood Pressure: 115 mmHg     Is BP treated: Yes     HDL Cholesterol: 54 mg/dL     Total Cholesterol: 181 mg/dL  ROS: Please see the history of present illness.    All other systems reviewed and are negative.    Studies Reviewed: .        Cardiac Studies & Procedures   ______________________________________________________________________________________________     ECHOCARDIOGRAM  ECHOCARDIOGRAM COMPLETE 04/19/2022  Narrative ECHOCARDIOGRAM REPORT    Patient Name:   Stephanie Hays Date of Exam: 04/19/2022 Medical Rec #:  [id number]     Height:       64.0 in Accession #:    [id number]    Weight:       196.8 lb Date of Birth:  14-Aug-1981      BSA:          1.943 m Patient Age:    44 years      BP:           128/72 mmHg Patient Gender: F             HR:           62 bpm. Exam Location:  Outpatient  Procedure: 2D Echo, Cardiac Doppler and Color Doppler  Indications:    R07.2 Precordial pain  History:        Patient has no prior history of Echocardiogram examinations. Signs/Symptoms:Chest Pain; Risk Factors:Hypertension and Non-Smoker. Patient has had chest pain x 1 week. Patient denies SOB and lwg edema.  Sonographer:    Annabella Cater RVT, RDCS (AE), RDMS Referring Phys: [id number] TIFFANY [place]  IMPRESSIONS   1. Left ventricular ejection fraction, by estimation, is 60 to 65%. Left ventricular ejection fraction by 3D  volume is 63 %. The left ventricle has normal function. The left ventricle has no regional wall motion abnormalities. Left ventricular diastolic parameters were normal. 2. Right ventricular systolic function is normal. The right ventricular size is normal. There is normal pulmonary artery systolic pressure. 3. The mitral valve is normal in structure. Trivial mitral valve regurgitation. No evidence of mitral stenosis. 4. The aortic valve is tricuspid. Aortic valve regurgitation is not visualized. No aortic stenosis is present. 5. The inferior vena cava is normal in size with greater than 50% respiratory variability, suggesting right atrial pressure of 3 mmHg.  FINDINGS Left Ventricle: Left ventricular ejection fraction, by estimation, is 60 to 65%. Left ventricular  ejection fraction by 3D volume is 63 %. The left ventricle has normal function. The left ventricle has no regional wall motion abnormalities. The left ventricular internal cavity size was normal in size. There is no left ventricular hypertrophy. Left ventricular diastolic parameters were normal.  Right Ventricle: The right ventricular size is normal. No increase in right ventricular wall thickness. Right ventricular systolic function is normal. There is normal pulmonary artery systolic pressure. The tricuspid regurgitant velocity is 2.47 m/s, and with an assumed right atrial pressure of 3 mmHg, the estimated right ventricular systolic pressure is 27.4 mmHg.  Left Atrium: Left atrial size was normal in size.  Right Atrium: Right atrial size was normal in size.  Pericardium: There is no evidence of pericardial effusion.  Mitral Valve: The mitral valve is normal in structure. Trivial mitral valve regurgitation. No evidence of mitral valve stenosis.  Tricuspid Valve: The tricuspid valve is normal in structure. Tricuspid valve regurgitation is trivial. No evidence of tricuspid stenosis.  Aortic Valve: The aortic valve is tricuspid. Aortic valve regurgitation is not visualized. No aortic stenosis is present. Aortic valve mean gradient measures 4.0 mmHg. Aortic valve peak gradient measures 8.9 mmHg.  Pulmonic Valve: The pulmonic valve was not well visualized. Pulmonic valve regurgitation is not visualized. No evidence of pulmonic stenosis.  Aorta: The aortic root, ascending aorta, aortic arch and descending aorta are all structurally normal, with no evidence of dilitation or obstruction.  Venous: The inferior vena cava is normal in size with greater than 50% respiratory variability, suggesting right atrial pressure of 3 mmHg.  IAS/Shunts: The atrial septum is grossly normal.   LEFT VENTRICLE PLAX 2D LVIDd:         4.26 cm         Diastology LVIDs:         2.92 cm         LV e' medial:    9.57  cm/s LV PW:         0.89 cm         LV E/e' medial:  8.4 LV IVS:        1.00 cm         LV e' lateral:   13.10 cm/s LV E/e' lateral: 6.1  LV Volumes (MOD) LV vol d, MOD    61.9 ml       3D Volume EF A2C:                           LV 3D EF:    Left LV vol d, MOD    54.7 ml                    ventricul A4C:  ar LV vol s, MOD    16.3 ml                    ejection A2C:                                        fraction LV vol s, MOD    10.1 ml                    by 3D A4C:                                        volume is LV SV MOD A2C:   45.6 ml                    63 %. LV SV MOD A4C:   54.7 ml LV SV MOD BP:    45.8 ml 3D Volume EF: 3D EF:        63 % LV EDV:       124 ml LV ESV:       45 ml LV SV:        79 ml  RIGHT VENTRICLE RV S prime:     16.10 cm/s TAPSE (M-mode): 2.6 cm  LEFT ATRIUM             Index        RIGHT ATRIUM           Index LA diam:        4.00 cm 2.06 cm/m   RA Area:     14.10 cm LA Vol (A2C):   51.9 ml 26.71 ml/m  RA Volume:   33.10 ml  17.04 ml/m LA Vol (A4C):   54.1 ml 27.84 ml/m LA Biplane Vol: 54.5 ml 28.05 ml/m AORTIC VALVE                   PULMONIC VALVE AV Vmax:           149.00 cm/s PV Vmax:       1.12 m/s AV Vmean:          94.500 cm/s PV Peak grad:  5.0 mmHg AV VTI:            0.282 m AV Peak Grad:      8.9 mmHg AV Mean Grad:      4.0 mmHg LVOT Vmax:         109.00 cm/s LVOT Vmean:        79.500 cm/s LVOT VTI:          0.238 m LVOT/AV VTI ratio: 0.84  AORTA Ao Root diam: 3.10 cm Ao Asc diam:  2.70 cm Ao Arch diam: 2.5 cm  MITRAL VALVE               TRICUSPID VALVE MV Area (PHT): 2.62 cm    TR Peak grad:   24.4 mmHg MV Decel Time: 290 msec    TR Vmax:        247.00 cm/s MV E velocity: 80.10 cm/s MV A velocity: 57.30 cm/s  SHUNTS MV E/A ratio:  1.40        Systemic VTI: 0.24 m  Shelda Bruckner MD Electronically signed by Shelda Bruckner MD Signature Date/Time: 04/19/2022/4:45:04  PM  Final    MONITORS  LONG TERM MONITOR (3-14 DAYS) 01/30/2023  Narrative 10 Day Zio Monitor  Quality: Fair.  Baseline artifact. Predominant rhythm: sinus rhythm Average heart rate: 70 bpm Max heart rate: 178 bpm Min heart rate: 48 bpm Pauses >2.5 seconds: none  Rare (<1%) PACs and PVCs  Tiffany C. Raford, MD, [facility] 03/05/2023 1:54 PM   CT SCANS  CT CORONARY MORPH W/CTA COR W/SCORE 05/21/2022  Addendum 05/21/2022 11:01 AM ADDENDUM REPORT: 05/21/2022 10:58  EXAM: OVER-READ INTERPRETATION  CT CHEST  The following report is an over-read performed by radiologist Dr. MYRTIS [facility] on 05/21/2022. This over-read does not include interpretation of cardiac or coronary anatomy or pathology. The coronary CTA interpretation by the cardiologist is attached.  COMPARISON:  None.  FINDINGS: Normal caliber thoracic aorta.  No dissection.  No mediastinal or hilar mass or adenopathy. The visualized esophagus is grossly normal.  The lungs are clear of an acute process. No worrisome pulmonary lesions or pulmonary nodules.  No significant upper abdominal findings.  No significant bony findings.  IMPRESSION: No significant extracardiac findings.   Electronically Signed By: MYRTIS Stammer M.D. On: 05/21/2022 10:58  Narrative CLINICAL DATA:  Chest pain  EXAM: Cardiac CTA  MEDICATIONS: Sub lingual nitro. 4mg  and lopressor  25mg   TECHNIQUE: The patient was scanned on a Siemens Force 192 slice scanner. Gantry rotation speed was 250 msecs. Collimation was .6 mm. A 100 kV prospective scan was triggered in the ascending thoracic aorta at 140 HU's Full mA was used between 35% and 75% of the R-R interval. Average HR during the scan was 68 bpm. The 3D data set was interpreted on a dedicated work station using MPR, MIP and VRT modes. A total of 80cc of contrast was used.  FINDINGS: Non-cardiac: See separate report from [facility].  No significant findings on limited lung and soft tissue windows.  Calcium  Score: No calcium  noted  Coronary Arteries: Right dominant with no anomalies  LM: Normal  LAD: Normal  D1: Normal  D2: Normal  Circumflex: Normal  OM1: Normal  OM2: Normal  RCA: Normal  PDA: Normal  PLA: Normal  IMPRESSION: 1.  Calcium  score 0  2.  Normal right dominant coronary arteries  3.  Normal ascending thoracic aorta 2.9 cm  Maude Emmer  Electronically Signed: By: Maude Emmer M.D. On: 05/21/2022 10:39     ______________________________________________________________________________________________        Risk Assessment/Calculations:            Physical Exam:   VS:  BP 115/68 (BP Location: Left Arm, Patient Position: Sitting, Cuff Size: Large)   Pulse 63   Resp 17   Ht 5' 4 (1.626 m)   Wt 243 lb (110.2 kg)   BMI 41.71 kg/m    Wt Readings from Last 3 Encounters:  11/19/24 243 lb (110.2 kg)  11/15/24 236 lb (107 kg)  10/13/24 246 lb (111.6 kg)    GEN: Well nourished, well developed in no acute distress NECK: No JVD; No carotid bruits CARDIAC: RRR, no murmurs, rubs, gallops RESPIRATORY:  Clear to auscultation without rales, wheezing or rhonchi  ABDOMEN: Soft, non-tender, non-distended EXTREMITIES:  No edema; No deformity   ASSESSMENT AND PLAN: .    Palpitations - ZIO 01/22/2023 with NSR and PVC burden <1%. Triggered episodes NSR or PVC. Previous TSH, CBC, BMP, mag unremarkable.  Present palpitations are infrequent and not bothersome.  Continue to stay well-hydrated, avoid caffeine/alcohol.  Previously discussed PRN BB but  she preferred to monitor symptoms   HTN - BP well controlled.  Continue chlorthalidone  25 mg daily, spironolactone  25 mg daily.  Refills provided.  Hypokalemia-in setting of diuretic use.  Labs 08/2024 with K3.6 on potassium 20 mEq daily.  Has difficulty with pill size.  Will instead Rx potassium 10 mEq 2 tablets daily.  If persistent issue with  pill size could consider stop chlorthalidone  and increase prolactin to 50 mg daily with repeat BMP for monitoring.  Atypical chest pain -cardiac CTA 05/21/2022 with coronary calcium  score of 0.  No recurrent chest pain.  No indication for further workup at this time.       Dispo: follow up in 6 months  Signed, Reche GORMAN Finder, NP   "

## 2024-11-19 NOTE — Patient Instructions (Signed)
 Medication Instructions:  CHANGE your potassium to two 10 mEq tablet per day  Okay to take together or to take separately  *If you need a refill on your cardiac medications before your next appointment, please call your pharmacy*  Follow-Up: At [facility], you and your health needs are our priority.  As part of our continuing mission to provide you with exceptional heart care, our providers are all part of one team.  This team includes your primary Cardiologist (physician) and Advanced Practice Providers or APPs (Physician Assistants and Nurse Practitioners) who all work together to provide you with the care you need, when you need it.  Your next appointment:   1 year(s)  Provider:   Annabella Scarce, MD, Rosaline Bane, NP, or Reche Finder, NP    We recommend signing up for the patient portal called MyChart.  Sign up information is provided on this After Visit Summary.  MyChart is used to connect with patients for Virtual Visits (Telemedicine).  Patients are able to view lab/test results, encounter notes, upcoming appointments, etc.  Non-urgent messages can be sent to your provider as well.   To learn more about what you can do with MyChart, go to [email or web address].   Other Instructions

## 2024-12-09 ENCOUNTER — Encounter: Payer: Self-pay | Admitting: Family Medicine

## 2024-12-09 ENCOUNTER — Other Ambulatory Visit (HOSPITAL_BASED_OUTPATIENT_CLINIC_OR_DEPARTMENT_OTHER): Payer: Self-pay

## 2024-12-09 ENCOUNTER — Ambulatory Visit: Admitting: Family Medicine

## 2024-12-09 ENCOUNTER — Other Ambulatory Visit (HOSPITAL_COMMUNITY): Payer: Self-pay

## 2024-12-09 VITALS — BP 127/85 | HR 66 | Ht 64.5 in | Wt 229.0 lb

## 2024-12-09 DIAGNOSIS — Z6838 Body mass index (BMI) 38.0-38.9, adult: Secondary | ICD-10-CM | POA: Diagnosis not present

## 2024-12-09 DIAGNOSIS — I10 Essential (primary) hypertension: Secondary | ICD-10-CM

## 2024-12-09 MED ORDER — ZEPBOUND 5 MG/0.5ML ~~LOC~~ SOAJ: 5.0000 mg | SUBCUTANEOUS | 1 refills | Status: AC

## 2024-12-09 MED ORDER — ZEPBOUND 5 MG/0.5ML ~~LOC~~ SOAJ
5.0000 mg | SUBCUTANEOUS | 1 refills | Status: DC
  Filled 2024-12-09: qty 2, 28d supply, fill #0

## 2024-12-09 NOTE — Progress Notes (Signed)
 "  Office: [phone]  /  Fax: [phone]  WEIGHT SUMMARY AND BIOMETRICS  Starting Date: 09/02/24  Starting Weight: 260lb   Weight Lost Since Last Visit: 7lb   Vitals BP: 127/85 Pulse Rate: 66 SpO2: 99 %   Body Composition  Body Fat %: 41.4 % Fat Mass (lbs): 94.8 lbs Muscle Mass (lbs): 127.6 lbs Total Body Water (lbs): 84 lbs Visceral Fat Rating : 11     HPI  Chief Complaint: OBESITY  Salaya is here to discuss her progress with her obesity treatment plan. She is on the the Category 3 Plan and states she is following her eating plan approximately 75 % of the time. She states she is exercising 30-45 minutes 1-2 times per week.   Interval History:  Since last office visit she is down 7 lb She is down 1.8 lb of muscle mass and down 5 lb of body fat since last visit This gives her a net weight loss of 31 lb in 3 mos of medically supervised weight management This is an 11.9% TBW loss She did get laid off from work this week and will lose insurance the end of this month She has improved satiety and food impulse control from Zepbound  She has occasional heartburn and localized injection redness She has limited gym workouts due to the weather the past 2 weeks  Pharmacotherapy: Zepbound  5 mg weekly  PHYSICAL EXAM:  Blood pressure 127/85, pulse 66, height 5' 4.5 (1.638 m), weight 229 lb (103.9 kg), SpO2 99%. Body mass index is 38.7 kg/m.  General: She is overweight, cooperative, alert, well developed, and in no acute distress. PSYCH: Has normal mood, affect and thought process.   Lungs: Normal breathing effort, no conversational dyspnea.   ASSESSMENT AND PLAN  TREATMENT PLAN FOR OBESITY:  Recommended Dietary Goals  Tesslyn is currently in the action stage of change. As such, her goal is to continue weight management plan. She has agreed to the Category 3 Plan.  Behavioral Intervention  We discussed the following Behavioral Modification Strategies today:  increasing lean protein intake to established goals, increasing fiber rich foods, avoiding skipping meals, increasing water intake , reading food labels , keeping healthy foods at home, practice mindfulness eating and understand the difference between hunger signals and cravings, work on managing stress, creating time for self-care and relaxation, continue to practice mindfulness when eating, and planning for success.  Additional resources provided today: NA  Recommended Physical Activity Goals  Hollye has been advised to work up to 150 minutes of moderate intensity aerobic activity a week and strengthening exercises 2-3 times per week for cardiovascular health, weight loss maintenance and preservation of muscle mass.   She has agreed to Increase the intensity, frequency or duration of strengthening exercises  and Increase the intensity, frequency or duration of aerobic exercises   Aim for gym workouts 3 days/ wk  Pharmacotherapy changes for the treatment of obesity: none  ASSOCIATED CONDITIONS ADDRESSED TODAY  Essential hypertension BP well controlled on Chlorthalidone  25 mg daily Look for improvements with weight reduction   Morbid obesity (HCC) -     Zepbound ; Inject 5 mg into the skin once a week.  Dispense: 2 mL; Refill: 1 She has adequate satiety and pace of weight loss on Zepbound .  Plans to resume weight training to reduce muscle loss.  Aim for 90 g of dietary protein intake daily.  Refill Zepbound  at 5 mg dose and will likely need to stop with upcoming drop of insurance 3/1.  Follow up will be made after new job starts.  Patient was counseled on the importance of maintaining healthy lifestyle habits, including balanced nutrition, regular physical activity, and behavioral modifications, while taking antiobesity medication.  Patient verbalized understanding that medication is an adjunct to, not a replacement for, lifestyle changes and that the long-term success and weight maintenance  depend on continued adherence to these strategies.  Reviewed pen training technique to reduce change of localized skin irritation Can use topical Benadryl  prn  BMI 38.0-38.9,adult      She was informed of the importance of frequent follow up visits to maximize her success with intensive lifestyle modifications for her multiple health conditions.   ATTESTASTION STATEMENTS:  Reviewed by clinician on day of visit: allergies, medications, problem list, medical history, surgical history, family history, social history, and previous encounter notes pertinent to obesity diagnosis.       Darice Haddock, D.O. DABFM, DABOM [facility] [address] [place], [place] [address] [phone] "

## 2024-12-09 NOTE — Addendum Note (Signed)
 Addended by: RUTHERFORD SUZEN RAMAN on: 12/09/2024 03:12 PM   Modules accepted: Orders
